# Patient Record
Sex: Male | Born: 1935 | Race: White | Hispanic: No | Marital: Single | Smoking: Never smoker
Health system: Southern US, Community
[De-identification: ages and names within clinical notes are randomized; demographics above are authoritative.]

## PROBLEM LIST (undated history)

## (undated) DIAGNOSIS — J8410 Pulmonary fibrosis, unspecified: Secondary | ICD-10-CM

## (undated) DIAGNOSIS — I10 Essential (primary) hypertension: Secondary | ICD-10-CM

## (undated) DIAGNOSIS — N189 Chronic kidney disease, unspecified: Secondary | ICD-10-CM

## (undated) DIAGNOSIS — I714 Abdominal aortic aneurysm, without rupture, unspecified: Secondary | ICD-10-CM

## (undated) DIAGNOSIS — C6290 Malignant neoplasm of unspecified testis, unspecified whether descended or undescended: Secondary | ICD-10-CM

## (undated) HISTORY — DX: Abdominal aortic aneurysm, without rupture, unspecified: I71.40

## (undated) HISTORY — DX: Abdominal aortic aneurysm, without rupture: I71.4

## (undated) HISTORY — DX: Pulmonary fibrosis, unspecified: J84.10

## (undated) HISTORY — PX: ABDOMINAL AORTIC ANEURYSM REPAIR: SUR1152

## (undated) HISTORY — DX: Essential (primary) hypertension: I10

## (undated) HISTORY — DX: Malignant neoplasm of unspecified testis, unspecified whether descended or undescended: C62.90

## (undated) HISTORY — DX: Chronic kidney disease, unspecified: N18.9

## (undated) HISTORY — PX: FINGER AMPUTATION: SHX636

---

## 2004-08-07 ENCOUNTER — Emergency Department: Payer: Self-pay | Admitting: Emergency Medicine

## 2004-12-17 ENCOUNTER — Ambulatory Visit: Payer: Self-pay | Admitting: Gastroenterology

## 2005-01-30 ENCOUNTER — Ambulatory Visit: Payer: Self-pay | Admitting: Gastroenterology

## 2006-02-05 ENCOUNTER — Ambulatory Visit: Payer: Self-pay | Admitting: Gastroenterology

## 2007-08-26 ENCOUNTER — Ambulatory Visit: Payer: Self-pay | Admitting: Nephrology

## 2008-07-01 ENCOUNTER — Ambulatory Visit: Payer: Self-pay | Admitting: Internal Medicine

## 2008-07-01 ENCOUNTER — Ambulatory Visit: Payer: Self-pay | Admitting: Gastroenterology

## 2008-08-16 ENCOUNTER — Ambulatory Visit: Payer: Self-pay | Admitting: Gastroenterology

## 2008-09-01 ENCOUNTER — Emergency Department: Payer: Self-pay | Admitting: Emergency Medicine

## 2008-09-10 ENCOUNTER — Ambulatory Visit: Payer: Self-pay | Admitting: Gastroenterology

## 2008-10-22 ENCOUNTER — Ambulatory Visit: Payer: Self-pay | Admitting: Gastroenterology

## 2008-10-23 ENCOUNTER — Ambulatory Visit: Payer: Self-pay | Admitting: Gastroenterology

## 2008-11-07 ENCOUNTER — Ambulatory Visit: Payer: Self-pay | Admitting: Vascular Surgery

## 2008-11-29 ENCOUNTER — Ambulatory Visit: Payer: Self-pay | Admitting: Cardiology

## 2008-11-29 ENCOUNTER — Ambulatory Visit: Payer: Self-pay | Admitting: Vascular Surgery

## 2008-12-03 ENCOUNTER — Inpatient Hospital Stay: Payer: Self-pay | Admitting: Vascular Surgery

## 2009-05-26 ENCOUNTER — Emergency Department: Payer: Self-pay | Admitting: Emergency Medicine

## 2009-08-20 ENCOUNTER — Emergency Department: Payer: Self-pay | Admitting: Emergency Medicine

## 2010-12-03 ENCOUNTER — Inpatient Hospital Stay: Payer: Self-pay | Admitting: Internal Medicine

## 2010-12-03 ENCOUNTER — Encounter: Payer: Self-pay | Admitting: Cardiology

## 2010-12-06 ENCOUNTER — Encounter: Payer: Self-pay | Admitting: Cardiology

## 2010-12-06 DIAGNOSIS — I359 Nonrheumatic aortic valve disorder, unspecified: Secondary | ICD-10-CM

## 2010-12-07 DIAGNOSIS — I951 Orthostatic hypotension: Secondary | ICD-10-CM

## 2010-12-07 DIAGNOSIS — R55 Syncope and collapse: Secondary | ICD-10-CM

## 2010-12-11 DIAGNOSIS — R55 Syncope and collapse: Secondary | ICD-10-CM

## 2010-12-18 ENCOUNTER — Encounter: Payer: Self-pay | Admitting: Cardiology

## 2010-12-22 ENCOUNTER — Encounter: Payer: Self-pay | Admitting: Cardiology

## 2010-12-26 ENCOUNTER — Encounter (INDEPENDENT_AMBULATORY_CARE_PROVIDER_SITE_OTHER): Payer: Medicare Other | Admitting: Cardiology

## 2010-12-26 ENCOUNTER — Encounter: Payer: Medicare Other | Admitting: Cardiovascular Disease

## 2010-12-26 ENCOUNTER — Encounter: Payer: Self-pay | Admitting: Cardiology

## 2010-12-26 DIAGNOSIS — I714 Abdominal aortic aneurysm, without rupture, unspecified: Secondary | ICD-10-CM | POA: Insufficient documentation

## 2010-12-26 DIAGNOSIS — R609 Edema, unspecified: Secondary | ICD-10-CM | POA: Insufficient documentation

## 2010-12-26 DIAGNOSIS — R55 Syncope and collapse: Secondary | ICD-10-CM | POA: Insufficient documentation

## 2010-12-26 DIAGNOSIS — N259 Disorder resulting from impaired renal tubular function, unspecified: Secondary | ICD-10-CM | POA: Insufficient documentation

## 2010-12-26 DIAGNOSIS — I951 Orthostatic hypotension: Secondary | ICD-10-CM | POA: Insufficient documentation

## 2010-12-30 NOTE — Assessment & Plan Note (Signed)
Summary: [facility] F/U from 12/12/10 / [facility]   Visit Type:  Initial Consult Primary Provider:  Barry Brunner, M.D.  CC:  F/U [facility].  Doesn't have much energy. Marland Kitchen  History of Present Illness: 77 year old male with long history of orthostatic hypotension for followup. He has had this for at least 10 years. It has been attributed to chemotherapeutic agent used for his testicular cancer in the past. He was admitted to [facility] in February of 2012 with syncope after trying to wean his Florinef. He had a systolic drop in his blood pressure of 50-100 mmHg. Midodrine was added at 10 mg p.o. t.i.d. but he had severe elevation in blood pressure at that dose with a systolic at 220. His medications were adjusted and ultimately included Mestinon, Florinef and low-dose midodrine. An echocardiogram showed normal LV function. There was mild tricuspid regurgitation and aortic insufficiency. The patient also has renal insufficiency. His potassium was 3.5 on March 8 and his BUN and creatinine were 28 and 1.72. Since he was discharged he has improved significantly. He denies dyspnea, chest pain, palpitations. He does not have presyncope or syncope with standing. He has developed significant pedal edema that increases during the day and improves overnight.  Current Medications (verified): 1)  Vitamin D3 50000 Unit Caps (Cholecalciferol) .... One Caps Every Month On The Third 2)  Omeprazole 20 Mg Cpdr (Omeprazole) .Marland Kitchen.. 1 Tablet Once Daily 3)  Florinef 0.2mg  .... 1 Tablet Two Times A Day 4)  Mestinon 60 Mg Tabs (Pyridostigmine Bromide) .Marland Kitchen.. 1 Tablet Once Daily 5)  Midodrine Hcl 5 Mg Tabs (Midodrine Hcl) .Marland Kitchen.. 1 Tablet Four Times A Day 6)  Xanax 0.25 Mg Tabs (Alprazolam) .Marland Kitchen.. 1 Tablet Two Times A Day As Needed 7)  Ted's .... Daily 8)  Ferrous Sulfate 325 (65 Fe) Mg Tabs (Ferrous Sulfate) .... One Tablet Two Times A Day  Allergies (verified): 1)  ! Pcn  Past History:  Family History: Last updated: 12/25/2010 Father:  heart disease-deceased age 52 Mother: AAA and stroke  Social History: Last updated: 12/25/2010 Retired  Single  Tobacco Use - No.  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  Family History: Reviewed history from 12/25/2010 and no changes required. Father: heart disease-deceased age 39 Mother: AAA and stroke  Social History: Reviewed history from 12/25/2010 and no changes required. Retired  Single  Tobacco Use - No.  Alcohol Use - no Regular Exercise - no Drug Use - no  Review of Systems       no fevers or chills, productive cough, hemoptysis, dysphasia, odynophagia, melena, hematochezia, dysuria, hematuria, rash, seizure activity, orthopnea, PND, pedal edema, claudication. Remaining systems are negative.   Vital Signs:  Patient profile:   77 year old male Height:      72 inches Weight:      209 pounds BMI:     28.45 Pulse rate:   60 / minute BP supine:   134 / 70  (left arm) BP sitting:   132 / 70  (left arm) BP standing:   112 / 58  (left arm) Cuff size:   regular  Vitals Entered By: Bishop Dublin, CMA (  December 26, 2010 3:00 PM)   Physical Exam  General:  Well-developed well-nourished in no acute distress.  Skin is warm and dry.  HEENT is normal.  Neck is supple. No thyromegaly.  Chest basilar crackles left greater than right Cardiovascular exam is regular rate and rhythm.  Abdominal exam nontender or distended. No masses palpated. Extremities show 2+ edema. neuro grossly intact    EKG  Procedure date:  12/26/2010  Findings:       Sinus rhythm at a rate of 60. First degree AV block. Prolonged QT interval. Nonspecific ST changes.  Impression & Recommendations:  Problem # 1:  HYPOTENSION, ORTHOSTATIC (ICD-458.0) This is much improved on present medications. It is a long standing problem and presumed secondary to the chemotherapy used to treat his testicular cancer in the past. We will continue with his present medications. He has developed increased edema. However I am hesitant to decrease his Florinef as he has had problems with recurrent syncope. He will continue with compression hose and keep his feet elevated. His family also was concerned about other possible etiologies of his orthostatic hypotension. We will obtain records from [facility] concerning previous evaluation. I believe it is most likely related to his previous chemotherapy. We may need to decrease his Florinef in the future if his edema worsens.  Problem # 2:  RENAL INSUFFICIENCY (ICD-588.9)  Problem # 3:  SYNCOPE (ICD-780.2) No recurrent episodes on present medications. The following medications were removed from the medication list:    Medrol (pak) 4 Mg Tabs (Methylprednisolone) .Marland Kitchen... Take as directed  Problem # 4:  EDEMA (ICD-782.3) As per #1.  Problem # 5:  ABDOMINAL AORTIC ANEURYSM (ICD-441.4) Managed by vascular surgery.  Patient Instructions: 1)  Your physician recommends that you schedule a follow-up appointment in: 6-8 weeks with Dr. Mariah Milling 2)  Your physician recommends that you continue on your current medications as directed. Please refer to the Current Medication list given to you today.

## 2011-02-02 ENCOUNTER — Telehealth: Payer: Self-pay | Admitting: Cardiovascular Disease

## 2011-02-02 ENCOUNTER — Ambulatory Visit: Payer: Medicare Other | Admitting: Cardiovascular Disease

## 2011-02-02 NOTE — Telephone Encounter (Signed)
LMOM to call back to reschedule appt from 02/02/11.

## 2011-02-05 ENCOUNTER — Encounter: Payer: Self-pay | Admitting: Cardiology

## 2011-02-09 ENCOUNTER — Encounter: Payer: Self-pay | Admitting: *Deleted

## 2011-02-10 ENCOUNTER — Other Ambulatory Visit: Payer: Self-pay | Admitting: Cardiology

## 2011-05-05 ENCOUNTER — Emergency Department: Payer: Self-pay | Admitting: Unknown Physician Specialty

## 2011-05-17 ENCOUNTER — Ambulatory Visit: Payer: Self-pay | Admitting: Gastroenterology

## 2011-07-05 ENCOUNTER — Emergency Department: Payer: Self-pay | Admitting: Emergency Medicine

## 2011-07-07 ENCOUNTER — Telehealth: Payer: Self-pay | Admitting: *Deleted

## 2011-07-07 NOTE — Telephone Encounter (Signed)
Called pt to see if wanted to f/u with TG post hospital (holter), pt does not want f/u at this time, thinks he is going to see another cardiologist today, but will call if wants to schedule here.

## 2011-07-09 ENCOUNTER — Telehealth: Payer: Self-pay | Admitting: Cardiovascular Disease

## 2011-07-09 NOTE — Telephone Encounter (Signed)
Pt daughter Eunice Blase called to check to see if monitor had been read from the hospital. Informed daughter that nurse had tried to schedule an appt with pt but pt refused appt stating that he had another md. Daughter states pt doesn't know what he is talking about. I offered pt daughter an appt to bring him in. She refused appt also stating she just wants monitor results then she will decide if he needs to come in

## 2011-07-10 NOTE — Telephone Encounter (Signed)
Spoke to daughter, she just wanted to see if holter showed any abnormality, if not she didn't want to add one more doctor to pt's care at this time if unnecessary. Notified her that results may not be completely downloaded at this time, and after read by MD then we could call with results. Daughter ok with this. I spoke with Britta Mccreedy at [facility] at [facility], she is processing now and will fax by Monday. She said pt can f/u with TG if he feels it is needed pending holter results.

## 2011-07-13 ENCOUNTER — Telehealth: Payer: Self-pay | Admitting: *Deleted

## 2011-07-13 ENCOUNTER — Encounter: Payer: Self-pay | Admitting: Cardiovascular Disease

## 2011-07-13 DIAGNOSIS — R42 Dizziness and giddiness: Secondary | ICD-10-CM

## 2011-07-13 NOTE — Telephone Encounter (Signed)
Per Dr. Mariah Milling, notified daughter of holter results from [facility], NSR overall with frequent PVCs, short run of SVT. Referring to previous phone note, told her that pt could schedule f/u if symptomatic. They will discuss and call back if want to follow up. Will fax result to pt per daughter request.

## 2011-08-13 ENCOUNTER — Inpatient Hospital Stay: Payer: Self-pay | Admitting: Internal Medicine

## 2011-09-29 ENCOUNTER — Ambulatory Visit: Payer: Self-pay | Admitting: Specialist

## 2012-05-09 ENCOUNTER — Emergency Department: Payer: Self-pay | Admitting: Emergency Medicine

## 2012-05-09 LAB — URINALYSIS, COMPLETE
Bacteria: NONE SEEN
Bilirubin,UR: NEGATIVE
Blood: NEGATIVE
Glucose,UR: 50 mg/dL (ref 0–75)
Ketone: NEGATIVE
Specific Gravity: 1.013 (ref 1.003–1.030)
Squamous Epithelial: <1
WBC UR: <1 /HPF (ref 0–5)

## 2012-07-15 ENCOUNTER — Emergency Department: Payer: Self-pay | Admitting: Emergency Medicine

## 2012-07-15 LAB — CBC
HGB: 12.2 g/dL — ABNORMAL LOW (ref 13.0–18.0)
MCH: 30.7 pg (ref 26.0–34.0)
Platelet: 149 10*3/uL — ABNORMAL LOW (ref 150–440)
RBC: 3.96 10*6/uL — ABNORMAL LOW (ref 4.40–5.90)
WBC: 8.8 10*3/uL (ref 3.8–10.6)

## 2012-07-15 LAB — COMPREHENSIVE METABOLIC PANEL
Alkaline Phosphatase: 115 U/L (ref 50–136)
Bilirubin,Total: 0.6 mg/dL (ref 0.2–1.0)
Chloride: 107 mmol/L (ref 98–107)
Co2: 28 mmol/L (ref 21–32)
Creatinine: 1.85 mg/dL — ABNORMAL HIGH (ref 0.60–1.30)
EGFR (African American): 40 — ABNORMAL LOW
EGFR (Non-African Amer.): 34 — ABNORMAL LOW
Osmolality: 293 (ref 275–301)
Potassium: 4.4 mmol/L (ref 3.5–5.1)
Sodium: 142 mmol/L (ref 136–145)

## 2012-07-15 LAB — TROPONIN I: Troponin-I: <0.02 ng/mL

## 2012-07-23 ENCOUNTER — Emergency Department: Payer: Self-pay | Admitting: Emergency Medicine

## 2012-07-23 LAB — URINALYSIS, COMPLETE
Bacteria: NONE SEEN
Blood: NEGATIVE
Glucose,UR: NEGATIVE mg/dL (ref 0–75)
Leukocyte Esterase: NEGATIVE
Nitrite: NEGATIVE
RBC,UR: 1 /HPF (ref 0–5)
WBC UR: NONE SEEN /HPF (ref 0–5)

## 2012-07-23 LAB — CBC WITH DIFFERENTIAL/PLATELET
Eosinophil %: 1.4 %
HCT: 38.8 % — ABNORMAL LOW (ref 40.0–52.0)
HGB: 13.2 g/dL (ref 13.0–18.0)
MCH: 31.1 pg (ref 26.0–34.0)
MCV: 92 fL (ref 80–100)
Neutrophil #: 7.8 10*3/uL — ABNORMAL HIGH (ref 1.4–6.5)
Platelet: 154 10*3/uL (ref 150–440)
RDW: 12.8 % (ref 11.5–14.5)

## 2012-07-23 LAB — BASIC METABOLIC PANEL
Calcium, Total: 8.8 mg/dL (ref 8.5–10.1)
Chloride: 106 mmol/L (ref 98–107)
EGFR (Non-African Amer.): 38 — ABNORMAL LOW
Glucose: 120 mg/dL — ABNORMAL HIGH (ref 65–99)
Osmolality: 292 (ref 275–301)
Potassium: 4.4 mmol/L (ref 3.5–5.1)
Sodium: 143 mmol/L (ref 136–145)

## 2012-07-26 ENCOUNTER — Emergency Department: Payer: Self-pay | Admitting: Emergency Medicine

## 2012-07-26 LAB — BASIC METABOLIC PANEL
Anion Gap: 7 (ref 7–16)
BUN: 29 mg/dL — ABNORMAL HIGH (ref 7–18)
Co2: 29 mmol/L (ref 21–32)
Creatinine: 1.86 mg/dL — ABNORMAL HIGH (ref 0.60–1.30)
EGFR (African American): 39 — ABNORMAL LOW
EGFR (Non-African Amer.): 34 — ABNORMAL LOW
Osmolality: 288 (ref 275–301)
Sodium: 141 mmol/L (ref 136–145)

## 2012-07-26 LAB — CBC
HCT: 39.9 % — ABNORMAL LOW (ref 40.0–52.0)
HGB: 13.5 g/dL (ref 13.0–18.0)
MCH: 31.2 pg (ref 26.0–34.0)
MCHC: 33.9 g/dL (ref 32.0–36.0)

## 2012-08-07 ENCOUNTER — Emergency Department: Payer: Self-pay | Admitting: Unknown Physician Specialty

## 2012-08-07 ENCOUNTER — Emergency Department: Payer: Self-pay | Admitting: Emergency Medicine

## 2012-08-07 LAB — CBC
HCT: 36.5 % — ABNORMAL LOW (ref 40.0–52.0)
HCT: 34.7 % — ABNORMAL LOW (ref 40.0–52.0)
MCH: 30.4 pg (ref 26.0–34.0)
MCH: 31.1 pg (ref 26.0–34.0)
MCHC: 33.1 g/dL (ref 32.0–36.0)
MCHC: 33.6 g/dL (ref 32.0–36.0)
MCV: 92 fL (ref 80–100)
Platelet: 212 10*3/uL (ref 150–440)
Platelet: 226 10*3/uL (ref 150–440)
RDW: 13.2 % (ref 11.5–14.5)
RDW: 13 % (ref 11.5–14.5)
WBC: 7.2 10*3/uL (ref 3.8–10.6)
WBC: 8.1 10*3/uL (ref 3.8–10.6)

## 2012-08-07 LAB — URINALYSIS, COMPLETE
Glucose,UR: 50 mg/dL (ref 0–75)
Ketone: NEGATIVE
Leukocyte Esterase: NEGATIVE
Nitrite: NEGATIVE
Protein: NEGATIVE
RBC,UR: <1 /HPF (ref 0–5)
Specific Gravity: 1.01 (ref 1.003–1.030)
WBC UR: NONE SEEN /HPF (ref 0–5)

## 2012-08-07 LAB — COMPREHENSIVE METABOLIC PANEL
Anion Gap: 8 (ref 7–16)
Bilirubin,Total: 0.5 mg/dL (ref 0.2–1.0)
Chloride: 106 mmol/L (ref 98–107)
Co2: 27 mmol/L (ref 21–32)
Creatinine: 1.81 mg/dL — ABNORMAL HIGH (ref 0.60–1.30)
EGFR (African American): 41 — ABNORMAL LOW
EGFR (Non-African Amer.): 35 — ABNORMAL LOW
Glucose: 97 mg/dL (ref 65–99)
SGOT(AST): 24 U/L (ref 15–37)
SGPT (ALT): 15 U/L (ref 12–78)
Sodium: 141 mmol/L (ref 136–145)

## 2012-08-07 LAB — CK TOTAL AND CKMB (NOT AT ARMC): CK-MB: 0.8 ng/mL (ref 0.5–3.6)

## 2012-08-07 LAB — TROPONIN I: Troponin-I: <0.02 ng/mL

## 2012-08-12 ENCOUNTER — Inpatient Hospital Stay: Payer: Self-pay | Admitting: Internal Medicine

## 2012-08-12 LAB — COMPREHENSIVE METABOLIC PANEL
Albumin: 3 g/dL — ABNORMAL LOW (ref 3.4–5.0)
Alkaline Phosphatase: 109 U/L (ref 50–136)
Anion Gap: 9 (ref 7–16)
BUN: 49 mg/dL — ABNORMAL HIGH (ref 7–18)
Calcium, Total: 8.7 mg/dL (ref 8.5–10.1)
Chloride: 110 mmol/L — ABNORMAL HIGH (ref 98–107)
Creatinine: 2.26 mg/dL — ABNORMAL HIGH (ref 0.60–1.30)
EGFR (African American): 31 — ABNORMAL LOW
Glucose: 139 mg/dL — ABNORMAL HIGH (ref 65–99)
Osmolality: 300 (ref 275–301)
Sodium: 143 mmol/L (ref 136–145)
Total Protein: 6.4 g/dL (ref 6.4–8.2)

## 2012-08-12 LAB — PROTIME-INR
INR: 1.1
Prothrombin Time: 14.3 secs (ref 11.5–14.7)

## 2012-08-12 LAB — URINALYSIS, COMPLETE
Bilirubin,UR: NEGATIVE
Glucose,UR: 150 mg/dL (ref 0–75)
Ketone: NEGATIVE
Nitrite: NEGATIVE
Protein: 30
Specific Gravity: 1.024 (ref 1.003–1.030)
WBC UR: 2 /HPF (ref 0–5)

## 2012-08-12 LAB — CBC WITH DIFFERENTIAL/PLATELET
Basophil #: 0.1 10*3/uL (ref 0.0–0.1)
Basophil %: 0.5 %
Eosinophil #: 0.1 10*3/uL (ref 0.0–0.7)
Eosinophil %: 0.6 %
HGB: 11.4 g/dL — ABNORMAL LOW (ref 13.0–18.0)
Lymphocyte %: 7.6 %
MCH: 31.1 pg (ref 26.0–34.0)
MCHC: 33.9 g/dL (ref 32.0–36.0)
Monocyte #: 0.7 x10 3/mm (ref 0.2–1.0)
Neutrophil %: 84.9 %
Platelet: 145 10*3/uL — ABNORMAL LOW (ref 150–440)
RBC: 3.68 10*6/uL — ABNORMAL LOW (ref 4.40–5.90)
WBC: 11 10*3/uL — ABNORMAL HIGH (ref 3.8–10.6)

## 2012-08-13 LAB — BASIC METABOLIC PANEL
Anion Gap: 8 (ref 7–16)
Calcium, Total: 8 mg/dL — ABNORMAL LOW (ref 8.5–10.1)
Chloride: 108 mmol/L — ABNORMAL HIGH (ref 98–107)
Co2: 26 mmol/L (ref 21–32)
Osmolality: 294 (ref 275–301)
Potassium: 3.8 mmol/L (ref 3.5–5.1)
Sodium: 142 mmol/L (ref 136–145)

## 2012-08-13 LAB — CULTURE, BLOOD (SINGLE)

## 2012-08-13 LAB — CBC WITH DIFFERENTIAL/PLATELET
Basophil #: 0 10*3/uL (ref 0.0–0.1)
Basophil %: 0.5 %
Eosinophil #: 0.3 10*3/uL (ref 0.0–0.7)
Eosinophil %: 4.2 %
HCT: 29.3 % — ABNORMAL LOW (ref 40.0–52.0)
Lymphocyte #: 0.7 10*3/uL — ABNORMAL LOW (ref 1.0–3.6)
MCH: 30.5 pg (ref 26.0–34.0)
MCV: 92 fL (ref 80–100)
Monocyte %: 9.8 %
Neutrophil #: 4.7 10*3/uL (ref 1.4–6.5)
Platelet: 110 10*3/uL — ABNORMAL LOW (ref 150–440)
RBC: 3.18 10*6/uL — ABNORMAL LOW (ref 4.40–5.90)
RDW: 13.1 % (ref 11.5–14.5)
WBC: 6.3 10*3/uL (ref 3.8–10.6)

## 2012-08-14 LAB — IRON AND TIBC
Iron Bind.Cap.(Total): 212 ug/dL — ABNORMAL LOW (ref 250–450)
Iron Saturation: 14 %
Iron: 29 ug/dL — ABNORMAL LOW (ref 65–175)
Unbound Iron-Bind.Cap.: 183 ug/dL

## 2012-08-14 LAB — CBC WITH DIFFERENTIAL/PLATELET
Eosinophil %: 0 %
HCT: 28.6 % — ABNORMAL LOW (ref 40.0–52.0)
Lymphocyte #: 0.4 10*3/uL — ABNORMAL LOW (ref 1.0–3.6)
Lymphocyte %: 8.8 %
MCH: 31.7 pg (ref 26.0–34.0)
MCV: 93 fL (ref 80–100)
Monocyte #: 0.2 x10 3/mm (ref 0.2–1.0)
Monocyte %: 3.6 %
Neutrophil #: 4.2 10*3/uL (ref 1.4–6.5)
Platelet: 100 10*3/uL — ABNORMAL LOW (ref 150–440)
RBC: 3.07 10*6/uL — ABNORMAL LOW (ref 4.40–5.90)
WBC: 4.8 10*3/uL (ref 3.8–10.6)

## 2012-08-14 LAB — BASIC METABOLIC PANEL
Anion Gap: 8 (ref 7–16)
Calcium, Total: 8 mg/dL — ABNORMAL LOW (ref 8.5–10.1)
Chloride: 110 mmol/L — ABNORMAL HIGH (ref 98–107)
Co2: 26 mmol/L (ref 21–32)
EGFR (Non-African Amer.): 40 — ABNORMAL LOW
Sodium: 144 mmol/L (ref 136–145)

## 2012-08-14 LAB — FERRITIN: Ferritin (ARMC): 848 ng/mL — ABNORMAL HIGH (ref 8–388)

## 2012-08-15 LAB — CBC WITH DIFFERENTIAL/PLATELET
Basophil #: 0 10*3/uL (ref 0.0–0.1)
Basophil %: 0.4 %
Eosinophil #: 0 10*3/uL (ref 0.0–0.7)
Eosinophil %: 0.1 %
HGB: 11 g/dL — ABNORMAL LOW (ref 13.0–18.0)
Lymphocyte %: 5.9 %
MCHC: 34.4 g/dL (ref 32.0–36.0)
Neutrophil %: 90 %
RBC: 3.54 10*6/uL — ABNORMAL LOW (ref 4.40–5.90)
RDW: 13 % (ref 11.5–14.5)
WBC: 7.8 10*3/uL (ref 3.8–10.6)

## 2012-08-15 LAB — BASIC METABOLIC PANEL
Anion Gap: 8 (ref 7–16)
BUN: 33 mg/dL — ABNORMAL HIGH (ref 7–18)
Co2: 30 mmol/L (ref 21–32)
Creatinine: 1.44 mg/dL — ABNORMAL HIGH (ref 0.60–1.30)
EGFR (African American): 54 — ABNORMAL LOW
EGFR (Non-African Amer.): 46 — ABNORMAL LOW
Sodium: 143 mmol/L (ref 136–145)

## 2012-08-16 ENCOUNTER — Ambulatory Visit: Payer: Self-pay | Admitting: Internal Medicine

## 2012-08-16 LAB — CBC WITH DIFFERENTIAL/PLATELET
Basophil #: 0 10*3/uL (ref 0.0–0.1)
Eosinophil #: 0 10*3/uL (ref 0.0–0.7)
HCT: 32.5 % — ABNORMAL LOW (ref 40.0–52.0)
HGB: 11 g/dL — ABNORMAL LOW (ref 13.0–18.0)
Lymphocyte #: 0.6 10*3/uL — ABNORMAL LOW (ref 1.0–3.6)
Lymphocyte %: 9.1 %
MCH: 30.4 pg (ref 26.0–34.0)
MCHC: 33.9 g/dL (ref 32.0–36.0)
MCV: 90 fL (ref 80–100)
Monocyte #: 0.1 x10 3/mm — ABNORMAL LOW (ref 0.2–1.0)
Monocyte %: 1.5 %
Neutrophil #: 6.1 10*3/uL (ref 1.4–6.5)
Neutrophil %: 88.7 %
Platelet: 120 10*3/uL — ABNORMAL LOW (ref 150–440)
RDW: 12.9 % (ref 11.5–14.5)
WBC: 6.8 10*3/uL (ref 3.8–10.6)

## 2012-08-16 LAB — BASIC METABOLIC PANEL
BUN: 36 mg/dL — ABNORMAL HIGH (ref 7–18)
BUN: 42 mg/dL — ABNORMAL HIGH (ref 7–18)
Calcium, Total: 8.1 mg/dL — ABNORMAL LOW (ref 8.5–10.1)
Chloride: 104 mmol/L (ref 98–107)
Chloride: 101 mmol/L (ref 98–107)
Co2: 32 mmol/L (ref 21–32)
Co2: 37 mmol/L — ABNORMAL HIGH (ref 21–32)
Creatinine: 1.3 mg/dL (ref 0.60–1.30)
Creatinine: 1.54 mg/dL — ABNORMAL HIGH (ref 0.60–1.30)
EGFR (African American): 49 — ABNORMAL LOW
EGFR (Non-African Amer.): 52 — ABNORMAL LOW
Potassium: 2.9 mmol/L — ABNORMAL LOW (ref 3.5–5.1)
Potassium: 2.4 mmol/L — CL (ref 3.5–5.1)
Sodium: 144 mmol/L (ref 136–145)
Sodium: 145 mmol/L (ref 136–145)

## 2012-08-16 LAB — TSH: Thyroid Stimulating Horm: 2.15 u[IU]/mL

## 2012-08-16 LAB — POTASSIUM: Potassium: 3.1 mmol/L — ABNORMAL LOW (ref 3.5–5.1)

## 2012-08-17 DIAGNOSIS — R000 Tachycardia, unspecified: Secondary | ICD-10-CM

## 2012-08-17 LAB — COMPREHENSIVE METABOLIC PANEL
Albumin: 2.3 g/dL — ABNORMAL LOW (ref 3.4–5.0)
Anion Gap: 8 (ref 7–16)
BUN: 42 mg/dL — ABNORMAL HIGH (ref 7–18)
Calcium, Total: 7.7 mg/dL — ABNORMAL LOW (ref 8.5–10.1)
Chloride: 106 mmol/L (ref 98–107)
EGFR (Non-African Amer.): 40 — ABNORMAL LOW
Osmolality: 306 (ref 275–301)
Potassium: 2.8 mmol/L — ABNORMAL LOW (ref 3.5–5.1)
SGOT(AST): 27 U/L (ref 15–37)
Sodium: 147 mmol/L — ABNORMAL HIGH (ref 136–145)

## 2012-08-17 LAB — CBC WITH DIFFERENTIAL/PLATELET
Basophil %: 0.2 %
Eosinophil %: 0.9 %
HGB: 11.8 g/dL — ABNORMAL LOW (ref 13.0–18.0)
Lymphocyte #: 0.6 10*3/uL — ABNORMAL LOW (ref 1.0–3.6)
MCH: 30.6 pg (ref 26.0–34.0)
MCV: 90 fL (ref 80–100)
Monocyte #: 0.6 x10 3/mm (ref 0.2–1.0)
Neutrophil %: 89.6 %
Platelet: 132 10*3/uL — ABNORMAL LOW (ref 150–440)
RBC: 3.85 10*6/uL — ABNORMAL LOW (ref 4.40–5.90)
RDW: 13.1 % (ref 11.5–14.5)

## 2012-08-17 LAB — CK-MB
CK-MB: 3.3 ng/mL (ref 0.5–3.6)
CK-MB: 1.6 ng/mL (ref 0.5–3.6)
CK-MB: 1.1 ng/mL (ref 0.5–3.6)

## 2012-08-17 LAB — CULTURE, BLOOD (SINGLE)

## 2012-08-17 LAB — APTT: Activated PTT: >160 s

## 2012-08-17 LAB — TROPONIN I
Troponin-I: 0.33 ng/mL — ABNORMAL HIGH
Troponin-I: 0.16 ng/mL — ABNORMAL HIGH

## 2012-08-17 LAB — MAGNESIUM
Magnesium: 1.6 mg/dL — ABNORMAL LOW
Magnesium: 2.4 mg/dL

## 2012-08-17 LAB — OCCULT BLOOD X 1 CARD TO LAB, STOOL: Occult Blood, Feces: POSITIVE

## 2012-08-18 LAB — CBC WITH DIFFERENTIAL/PLATELET
Basophil #: 0 10*3/uL (ref 0.0–0.1)
HCT: 33 % — ABNORMAL LOW (ref 40.0–52.0)
HGB: 10.5 g/dL — ABNORMAL LOW (ref 13.0–18.0)
Lymphocyte #: 0.3 10*3/uL — ABNORMAL LOW (ref 1.0–3.6)
MCH: 29 pg (ref 26.0–34.0)
MCV: 91 fL (ref 80–100)
Monocyte #: 0.7 x10 3/mm (ref 0.2–1.0)
Platelet: 98 10*3/uL — ABNORMAL LOW (ref 150–440)
RDW: 13.4 % (ref 11.5–14.5)
WBC: 14.6 10*3/uL — ABNORMAL HIGH (ref 3.8–10.6)

## 2012-08-18 LAB — BASIC METABOLIC PANEL
Anion Gap: 10 (ref 7–16)
BUN: 33 mg/dL — ABNORMAL HIGH (ref 7–18)
Chloride: 110 mmol/L — ABNORMAL HIGH (ref 98–107)
Co2: 26 mmol/L (ref 21–32)
Creatinine: 1.46 mg/dL — ABNORMAL HIGH (ref 0.60–1.30)
Glucose: 247 mg/dL — ABNORMAL HIGH (ref 65–99)
Potassium: 3.5 mmol/L (ref 3.5–5.1)

## 2012-08-18 LAB — APTT: Activated PTT: 127.3 secs — ABNORMAL HIGH (ref 23.6–35.9)

## 2012-08-18 LAB — POTASSIUM: Potassium: 3.5 mmol/L (ref 3.5–5.1)

## 2012-08-18 LAB — PHOSPHORUS
Phosphorus: 2.4 mg/dL — ABNORMAL LOW (ref 2.5–4.9)
Phosphorus: 2.4 mg/dL — ABNORMAL LOW (ref 2.5–4.9)

## 2012-08-18 LAB — MAGNESIUM: Magnesium: 2.3 mg/dL

## 2012-08-19 LAB — CBC WITH DIFFERENTIAL/PLATELET
Basophil #: 0 10*3/uL (ref 0.0–0.1)
Basophil %: 0.2 %
HCT: 30.3 % — ABNORMAL LOW (ref 40.0–52.0)
HGB: 10.2 g/dL — ABNORMAL LOW (ref 13.0–18.0)
Lymphocyte %: 2.8 %
Monocyte %: 6 %
Neutrophil #: 15.9 10*3/uL — ABNORMAL HIGH (ref 1.4–6.5)
Neutrophil %: 91 %
WBC: 17.5 10*3/uL — ABNORMAL HIGH (ref 3.8–10.6)

## 2012-08-19 LAB — BASIC METABOLIC PANEL
BUN: 36 mg/dL — ABNORMAL HIGH (ref 7–18)
Chloride: 111 mmol/L — ABNORMAL HIGH (ref 98–107)
Co2: 27 mmol/L (ref 21–32)
Creatinine: 1.25 mg/dL (ref 0.60–1.30)
EGFR (Non-African Amer.): 55 — ABNORMAL LOW
Glucose: 171 mg/dL — ABNORMAL HIGH (ref 65–99)
Osmolality: 303 (ref 275–301)
Potassium: 4.2 mmol/L (ref 3.5–5.1)
Sodium: 146 mmol/L — ABNORMAL HIGH (ref 136–145)

## 2012-08-19 LAB — POTASSIUM: Potassium: 4 mmol/L (ref 3.5–5.1)

## 2012-08-19 LAB — PHOSPHORUS: Phosphorus: 3.7 mg/dL (ref 2.5–4.9)

## 2012-08-19 LAB — APTT: Activated PTT: 87.4 secs — ABNORMAL HIGH (ref 23.6–35.9)

## 2012-08-20 LAB — CBC WITH DIFFERENTIAL/PLATELET
Basophil #: 0 10*3/uL (ref 0.0–0.1)
Eosinophil %: 0 %
HCT: 28.9 % — ABNORMAL LOW (ref 40.0–52.0)
HGB: 9.8 g/dL — ABNORMAL LOW (ref 13.0–18.0)
Lymphocyte #: 0.3 10*3/uL — ABNORMAL LOW (ref 1.0–3.6)
Lymphocyte %: 2.1 %
MCH: 31.1 pg (ref 26.0–34.0)
MCV: 92 fL (ref 80–100)
Monocyte %: 3.1 %
Neutrophil %: 94.7 %
Platelet: 94 10*3/uL — ABNORMAL LOW (ref 150–440)
RBC: 3.14 10*6/uL — ABNORMAL LOW (ref 4.40–5.90)
RDW: 13.7 % (ref 11.5–14.5)

## 2012-08-20 LAB — VANCOMYCIN, TROUGH
Vancomycin, Trough: 23 ug/mL (ref 10–20)
Vancomycin, Trough: 22 ug/mL (ref 10–20)

## 2012-08-20 LAB — BASIC METABOLIC PANEL
Anion Gap: 7 (ref 7–16)
Calcium, Total: 7.8 mg/dL — ABNORMAL LOW (ref 8.5–10.1)
Co2: 29 mmol/L (ref 21–32)
EGFR (African American): 52 — ABNORMAL LOW
EGFR (Non-African Amer.): 45 — ABNORMAL LOW
Glucose: 190 mg/dL — ABNORMAL HIGH (ref 65–99)
Osmolality: 300 (ref 275–301)
Potassium: 3.6 mmol/L (ref 3.5–5.1)
Sodium: 142 mmol/L (ref 136–145)

## 2012-08-20 LAB — APTT: Activated PTT: 29.5 secs (ref 23.6–35.9)

## 2012-08-21 LAB — CBC WITH DIFFERENTIAL/PLATELET
Basophil #: 0 10*3/uL (ref 0.0–0.1)
Basophil %: 0 %
Eosinophil %: 0 %
HCT: 29.5 % — ABNORMAL LOW (ref 40.0–52.0)
HGB: 9.9 g/dL — ABNORMAL LOW (ref 13.0–18.0)
Lymphocyte #: 0.3 10*3/uL — ABNORMAL LOW (ref 1.0–3.6)
MCH: 30.7 pg (ref 26.0–34.0)
MCV: 92 fL (ref 80–100)
Monocyte #: 0.4 x10 3/mm (ref 0.2–1.0)
Monocyte %: 2.8 %
Neutrophil %: 94.7 %
Platelet: 116 10*3/uL — ABNORMAL LOW (ref 150–440)
RBC: 3.21 10*6/uL — ABNORMAL LOW (ref 4.40–5.90)
WBC: 13.4 10*3/uL — ABNORMAL HIGH (ref 3.8–10.6)

## 2012-08-21 LAB — BASIC METABOLIC PANEL
Anion Gap: 5 — ABNORMAL LOW (ref 7–16)
Calcium, Total: 7.8 mg/dL — ABNORMAL LOW (ref 8.5–10.1)
Co2: 31 mmol/L (ref 21–32)
EGFR (African American): 47 — ABNORMAL LOW
EGFR (Non-African Amer.): 41 — ABNORMAL LOW
Glucose: 195 mg/dL — ABNORMAL HIGH (ref 65–99)
Osmolality: 311 (ref 275–301)
Potassium: 4.3 mmol/L (ref 3.5–5.1)

## 2012-08-21 LAB — PHOSPHORUS: Phosphorus: 3.8 mg/dL (ref 2.5–4.9)

## 2012-08-22 LAB — BASIC METABOLIC PANEL
BUN: 75 mg/dL — ABNORMAL HIGH (ref 7–18)
Calcium, Total: 7.8 mg/dL — ABNORMAL LOW (ref 8.5–10.1)
Chloride: 108 mmol/L — ABNORMAL HIGH (ref 98–107)
Co2: 29 mmol/L (ref 21–32)
Osmolality: 317 (ref 275–301)
Potassium: 4.7 mmol/L (ref 3.5–5.1)
Sodium: 144 mmol/L (ref 136–145)

## 2012-08-23 LAB — CBC WITH DIFFERENTIAL/PLATELET
Basophil %: 0.1 %
Eosinophil #: 0 10*3/uL (ref 0.0–0.7)
Eosinophil %: 0 %
Lymphocyte #: 0.2 10*3/uL — ABNORMAL LOW (ref 1.0–3.6)
Lymphocyte %: 3.5 %
MCH: 30.2 pg (ref 26.0–34.0)
MCHC: 32.7 g/dL (ref 32.0–36.0)
Monocyte #: 0.3 x10 3/mm (ref 0.2–1.0)
Neutrophil %: 92.3 %
Platelet: 87 10*3/uL — ABNORMAL LOW (ref 150–440)
RBC: 3.47 10*6/uL — ABNORMAL LOW (ref 4.40–5.90)
WBC: 7 10*3/uL (ref 3.8–10.6)

## 2012-08-23 LAB — BASIC METABOLIC PANEL
BUN: 73 mg/dL — ABNORMAL HIGH (ref 7–18)
Calcium, Total: 7.7 mg/dL — ABNORMAL LOW (ref 8.5–10.1)
Chloride: 107 mmol/L (ref 98–107)
Co2: 31 mmol/L (ref 21–32)
Creatinine: 1.24 mg/dL (ref 0.60–1.30)
EGFR (African American): >60
Potassium: 5.4 mmol/L — ABNORMAL HIGH (ref 3.5–5.1)
Sodium: 143 mmol/L (ref 136–145)

## 2012-08-24 LAB — BASIC METABOLIC PANEL
Anion Gap: 9 (ref 7–16)
BUN: 68 mg/dL — ABNORMAL HIGH (ref 7–18)
Calcium, Total: 7.6 mg/dL — ABNORMAL LOW (ref 8.5–10.1)
Chloride: 101 mmol/L (ref 98–107)
Co2: 29 mmol/L (ref 21–32)
Creatinine: 1.19 mg/dL (ref 0.60–1.30)
Osmolality: 307 (ref 275–301)
Potassium: 4.9 mmol/L (ref 3.5–5.1)

## 2012-08-25 LAB — BASIC METABOLIC PANEL
Chloride: 103 mmol/L (ref 98–107)
Co2: 32 mmol/L (ref 21–32)
Creatinine: 1.21 mg/dL (ref 0.60–1.30)
EGFR (African American): >60
EGFR (Non-African Amer.): 57 — ABNORMAL LOW
Glucose: 164 mg/dL — ABNORMAL HIGH (ref 65–99)
Sodium: 139 mmol/L (ref 136–145)

## 2012-08-26 LAB — BASIC METABOLIC PANEL
Anion Gap: 6 — ABNORMAL LOW (ref 7–16)
Calcium, Total: 7.7 mg/dL — ABNORMAL LOW (ref 8.5–10.1)
Chloride: 105 mmol/L (ref 98–107)
Co2: 31 mmol/L (ref 21–32)
EGFR (African American): >60
Glucose: 165 mg/dL — ABNORMAL HIGH (ref 65–99)
Osmolality: 313 (ref 275–301)
Sodium: 142 mmol/L (ref 136–145)

## 2012-08-26 LAB — PHOSPHORUS: Phosphorus: 4.2 mg/dL (ref 2.5–4.9)

## 2012-08-27 LAB — BASIC METABOLIC PANEL
Anion Gap: 5 — ABNORMAL LOW (ref 7–16)
BUN: 97 mg/dL — ABNORMAL HIGH (ref 7–18)
Calcium, Total: 7.4 mg/dL — ABNORMAL LOW (ref 8.5–10.1)
Creatinine: 1.38 mg/dL — ABNORMAL HIGH (ref 0.60–1.30)
EGFR (Non-African Amer.): 49 — ABNORMAL LOW
Glucose: 197 mg/dL — ABNORMAL HIGH (ref 65–99)
Osmolality: 319 (ref 275–301)
Potassium: 4.6 mmol/L (ref 3.5–5.1)

## 2012-08-27 LAB — CBC WITH DIFFERENTIAL/PLATELET
Basophil #: 0 10*3/uL (ref 0.0–0.1)
Basophil %: 0 %
Eosinophil #: 0 10*3/uL (ref 0.0–0.7)
Eosinophil %: 0 %
HCT: 31.5 % — ABNORMAL LOW (ref 40.0–52.0)
HGB: 10.2 g/dL — ABNORMAL LOW (ref 13.0–18.0)
Lymphocyte #: 0.2 10*3/uL — ABNORMAL LOW (ref 1.0–3.6)
Lymphocyte %: 1.9 %
MCH: 29.7 pg (ref 26.0–34.0)
MCHC: 32.5 g/dL (ref 32.0–36.0)
Monocyte #: 0.2 x10 3/mm (ref 0.2–1.0)
Neutrophil %: 96 %
Platelet: 118 10*3/uL — ABNORMAL LOW (ref 150–440)
WBC: 11 10*3/uL — ABNORMAL HIGH (ref 3.8–10.6)

## 2012-08-29 LAB — CBC WITH DIFFERENTIAL/PLATELET
Basophil #: 0 10*3/uL (ref 0.0–0.1)
Eosinophil #: 0 10*3/uL (ref 0.0–0.7)
HGB: 10.5 g/dL — ABNORMAL LOW (ref 13.0–18.0)
MCH: 30.5 pg (ref 26.0–34.0)
Monocyte #: 0.3 x10 3/mm (ref 0.2–1.0)
Monocyte %: 2.2 %
Neutrophil %: 96.3 %
Platelet: 98 10*3/uL — ABNORMAL LOW (ref 150–440)
RBC: 3.45 10*6/uL — ABNORMAL LOW (ref 4.40–5.90)
WBC: 12.2 10*3/uL — ABNORMAL HIGH (ref 3.8–10.6)

## 2012-08-29 LAB — BASIC METABOLIC PANEL
BUN: 97 mg/dL — ABNORMAL HIGH (ref 7–18)
Creatinine: 1.44 mg/dL — ABNORMAL HIGH (ref 0.60–1.30)
EGFR (African American): 54 — ABNORMAL LOW
EGFR (Non-African Amer.): 46 — ABNORMAL LOW
Glucose: 207 mg/dL — ABNORMAL HIGH (ref 65–99)
Osmolality: 323 (ref 275–301)
Sodium: 144 mmol/L (ref 136–145)

## 2012-09-11 ENCOUNTER — Ambulatory Visit: Payer: Self-pay | Admitting: Internal Medicine

## 2012-09-11 DEATH — deceased

## 2013-07-14 IMAGING — CR DG CHEST 1V PORT
1 series · 1 of 1 positions shown · non-contrast
Comparison: none

REASON FOR EXAM: central line placement confirmation
COMMENTS:

PROCEDURE:     DXR - DXR PORTABLE CHEST SINGLE VIEW  - August 16, 2012 [DATE]
RESULT:     Comparison: None

[ap]
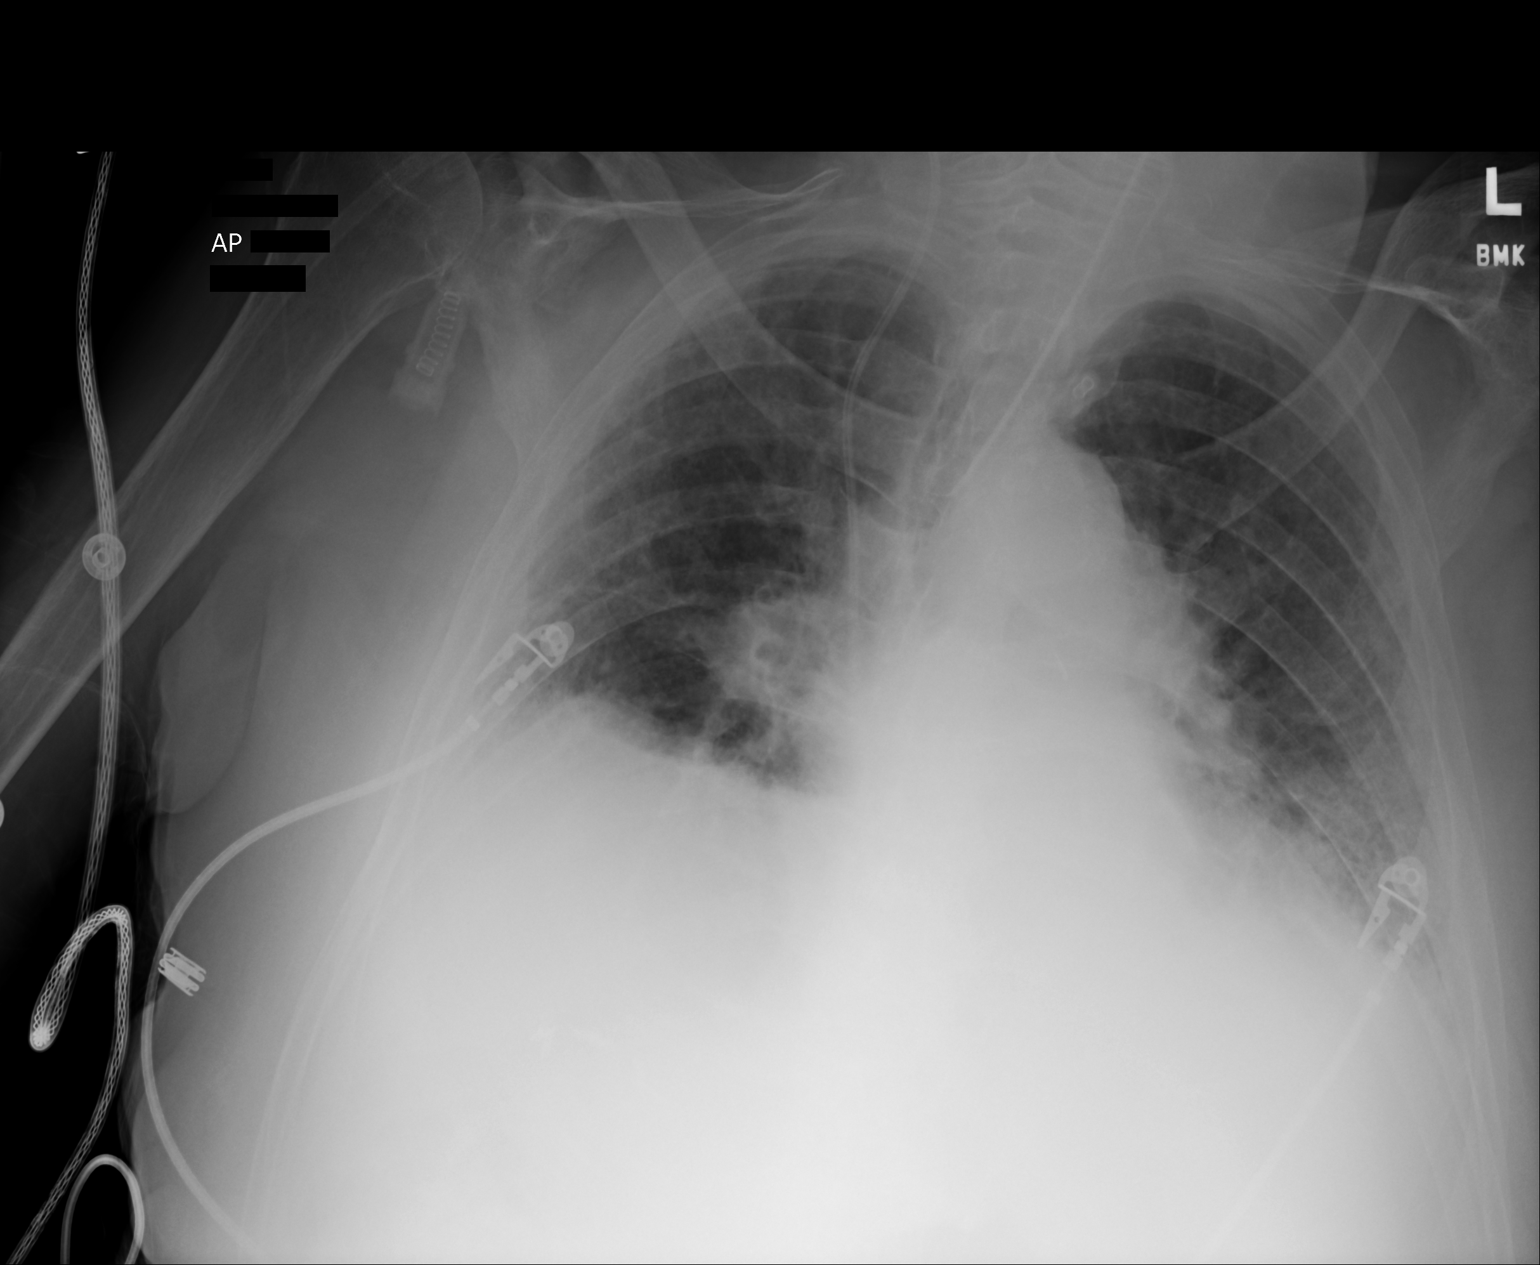

[1 of 1 positions shown; findings below may reference images not displayed]

FINDINGS: Single portable AP chest radiograph is provided. There is a right-sided
central venous catheter with the tip projecting over the right atrium. There
is a endotracheal tube with the tip 3.3 cm above the carina. There is
bibasilar airspace disease, left greater than right which may represent
atelectasis versus pneumonia. There is no pneumothorax. The heart size is
normal. The osseous structures are unremarkable.
IMPRESSION: Please see above.

[REDACTED]

## 2013-07-15 IMAGING — CR DG CHEST 1V PORT
1 series · 1 of 1 positions shown · non-contrast
Comparison: none

REASON FOR EXAM: respiratory failure, on the vent
COMMENTS:

PROCEDURE:     DXR - DXR PORTABLE CHEST SINGLE VIEW  - August 17, 2012  [DATE]
RESULT:     Comparison: 08/16/2012

[ap]
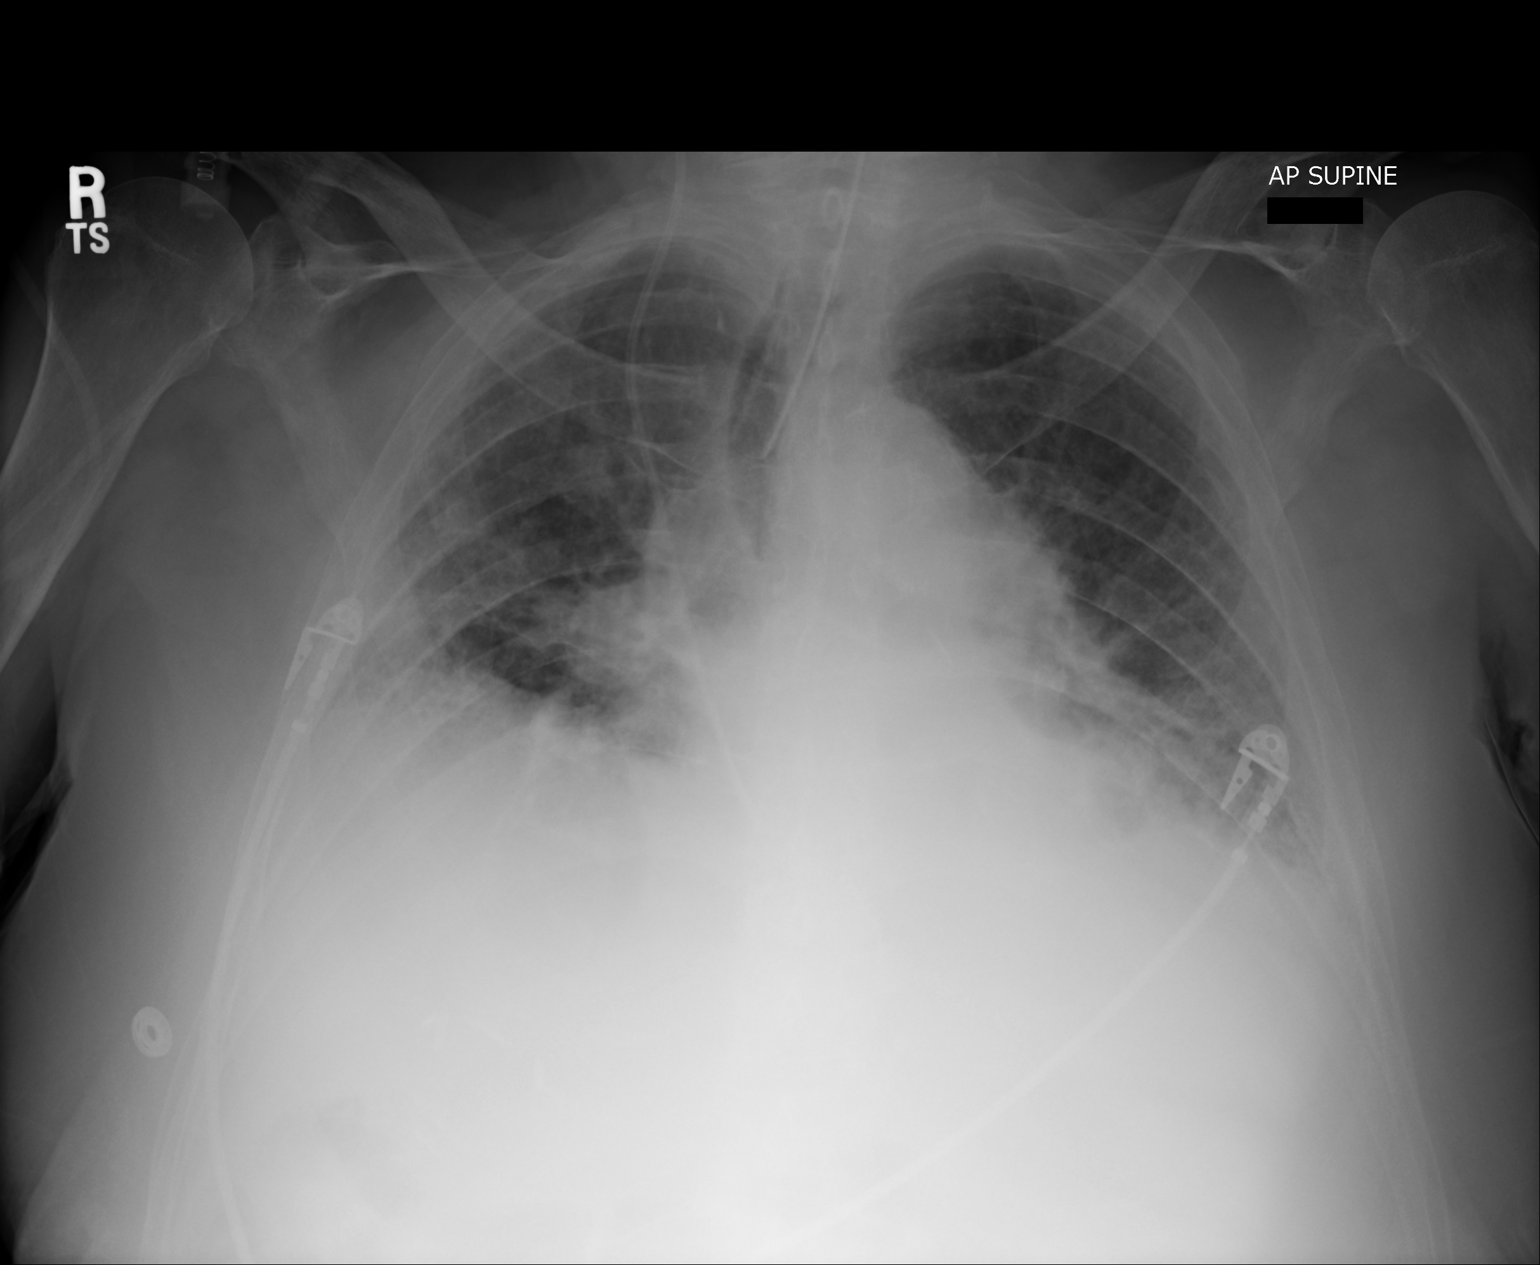

[1 of 1 positions shown; findings below may reference images not displayed]

FINDINGS: Single portable AP chest radiograph is provided. There is a right-sided
central venous catheter with the tip projecting over the right atrium. There
is a endotracheal tube with the tip 3.3 cm above the carina. There is
bibasilar airspace disease, left greater than right which may represent
atelectasis versus pneumonia. There is no pneumothorax. The heart size is
normal. The osseous structures are unremarkable.
IMPRESSION: There is no significant interval change.

[REDACTED]

## 2013-07-16 IMAGING — CR DG ABDOMEN 1V
1 series · 1 of 1 positions shown · non-contrast
Comparison: none

REASON FOR EXAM: CHECK OGT Placement
COMMENTS:

[ap]
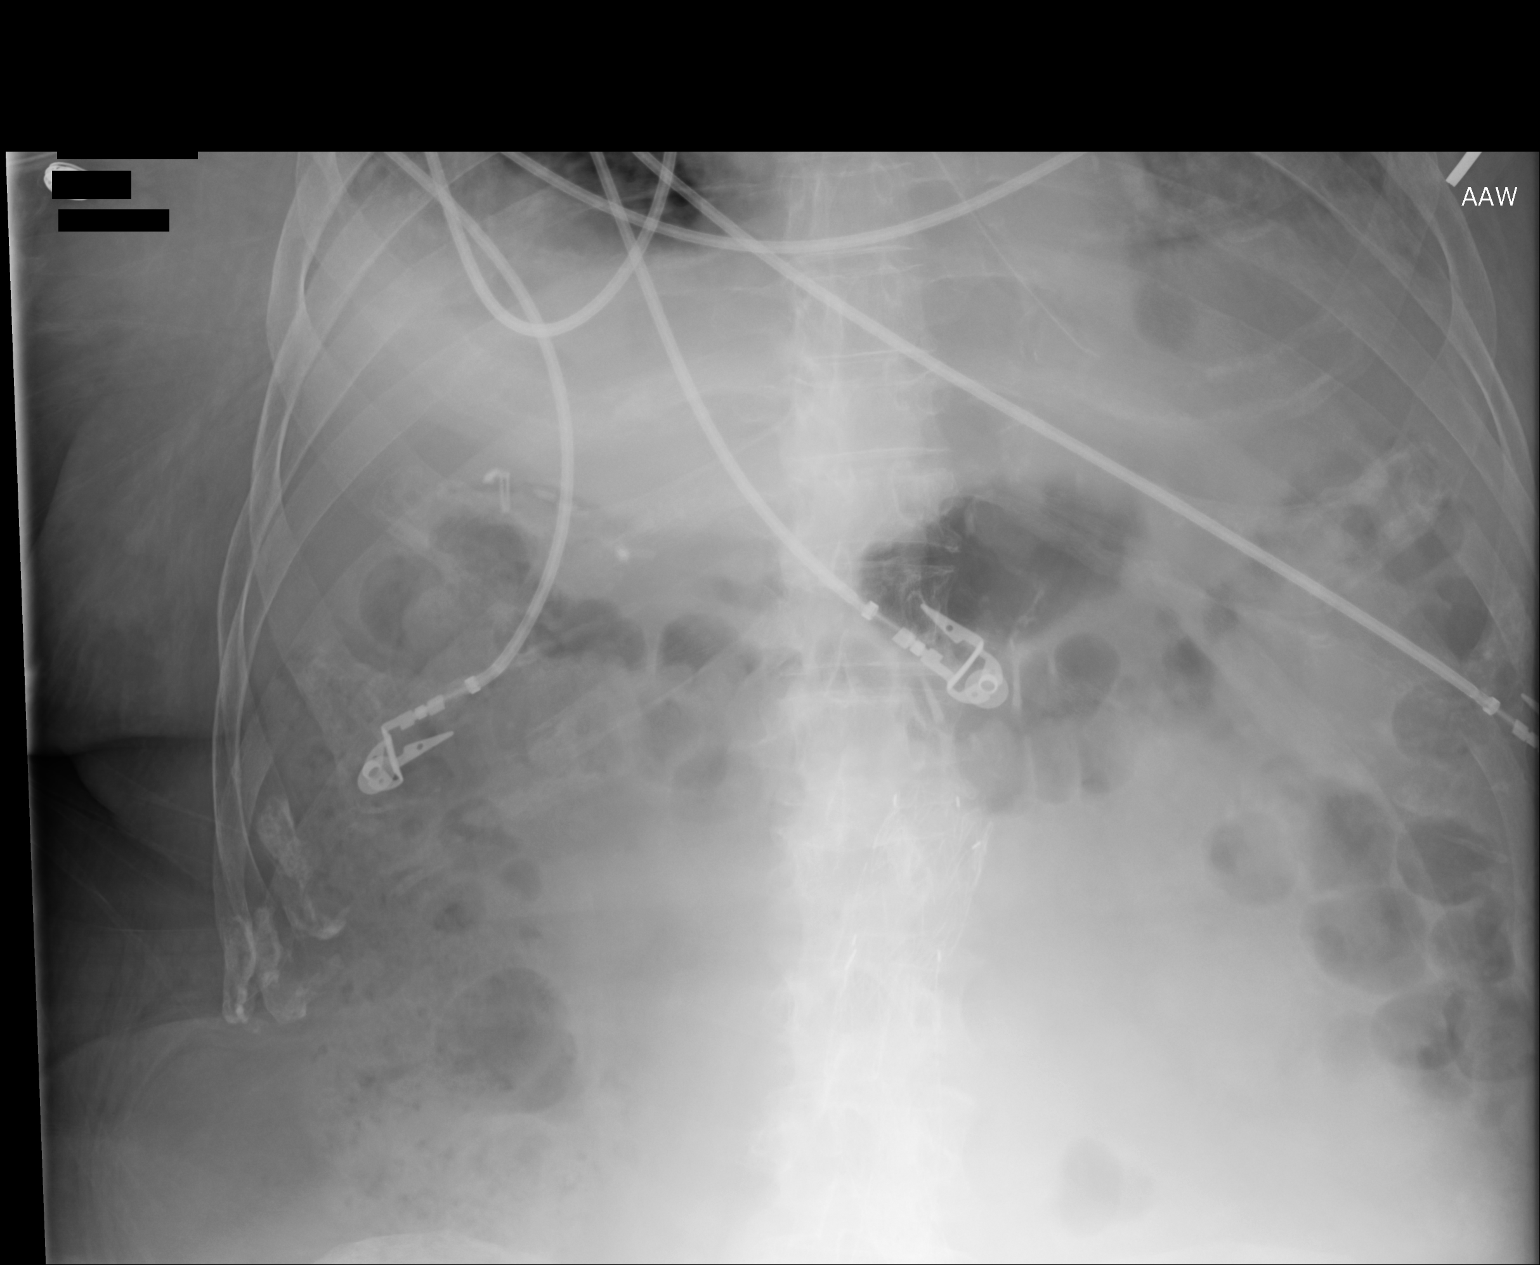

[1 of 1 positions shown; findings below may reference images not displayed]

PROCEDURE:     DXR - DXR ABDOMEN AP ONLY  - August 18, 2012  [DATE]

RESULT:     A portable film of the lower chest upper and mid abdomen is
submitted. The tip of the nasogastric tube lies in the expected location of
the gastric cardia. Advancement by 10 to 15 cm is recommended to assure that
the proximal port which is now above the hemidiaphragm will be well within
the stomach.
IMPRESSION: The orogastric tube tip is just below the level of the left
hemidiaphragm advancement is needed.

[REDACTED]

## 2013-07-17 IMAGING — CR DG CHEST 1V PORT
1 series · 1 of 1 positions shown · non-contrast
Comparison: none

REASON FOR EXAM: vent
COMMENTS:

[ap]
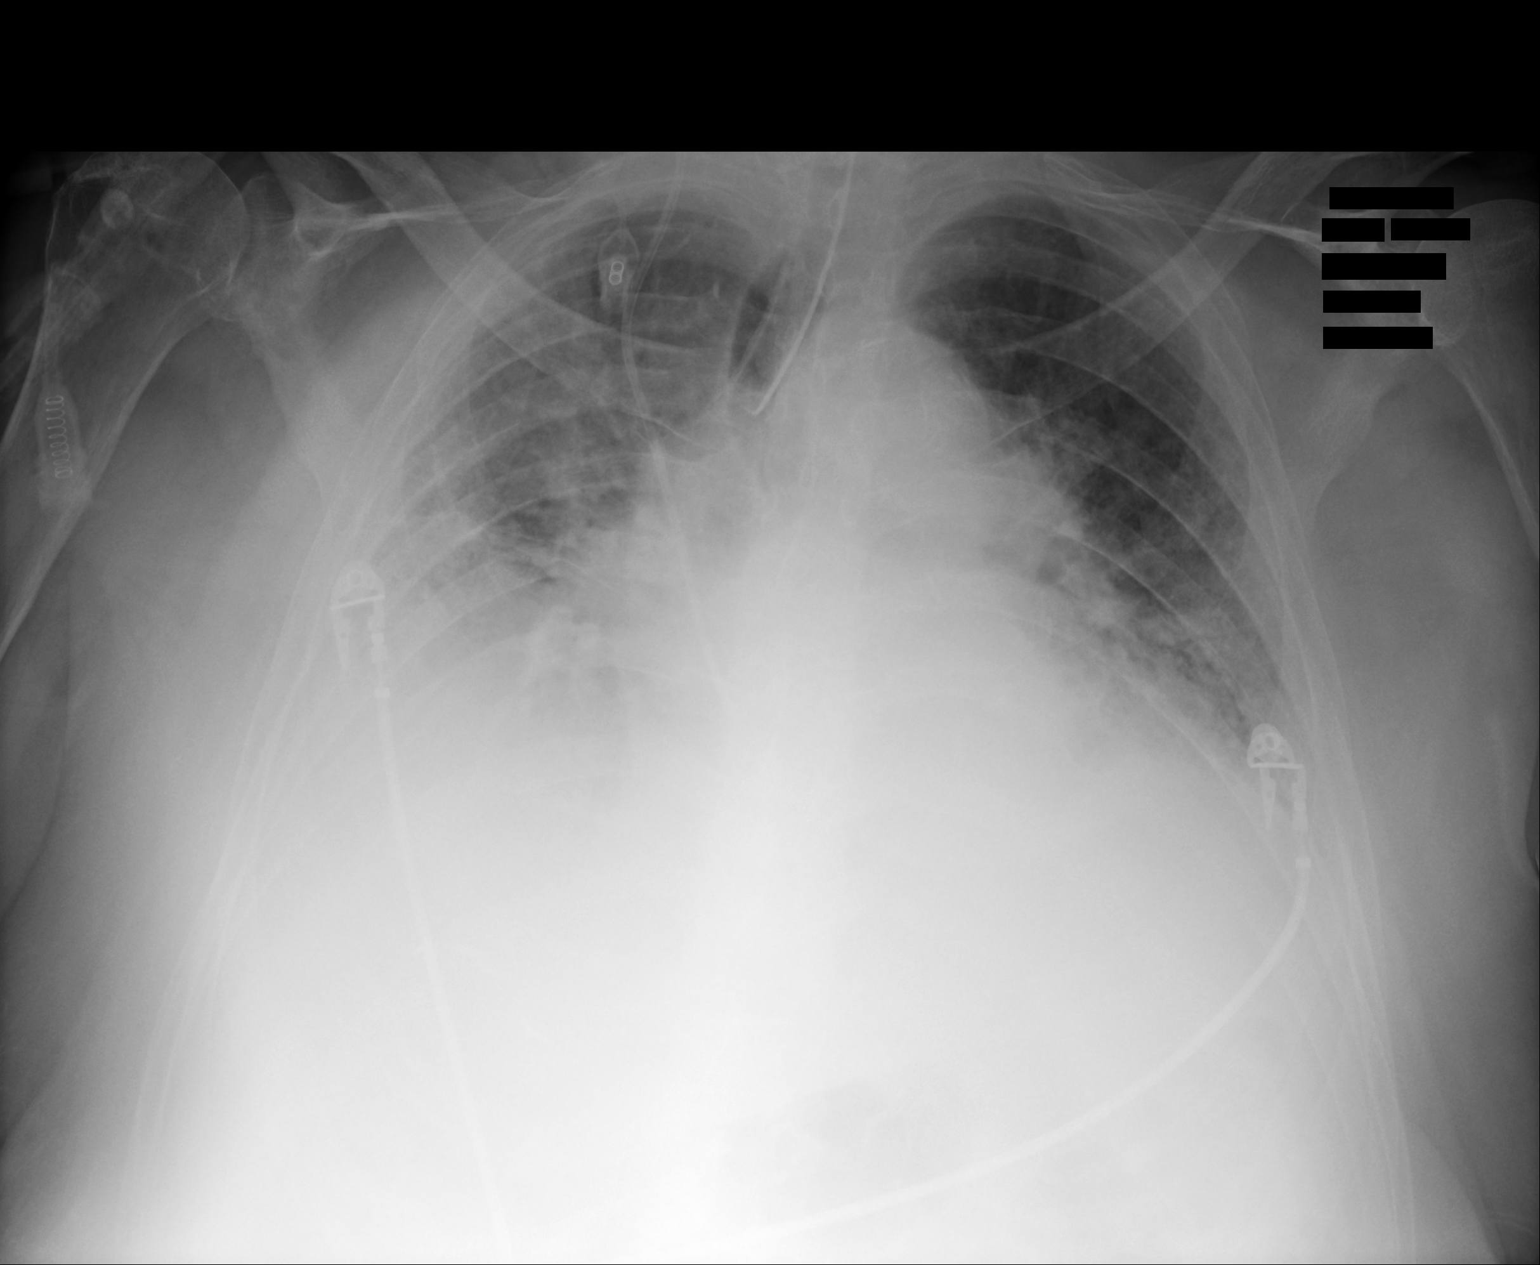

[1 of 1 positions shown; findings below may reference images not displayed]

PROCEDURE:     DXR - DXR PORTABLE CHEST SINGLE VIEW  - August 19, 2012  [DATE]

RESULT:     Comparison is made to the study 18 August, 2012 at [DATE] p.m.
Endotracheal tube is present at the level of the sternoclavicular joints. A
right internal jugular central venous catheter passes into the area the
right atrium. The cardiac silhouette is enlarged. There is very shallow
inspiration with basilar infiltrate or atelectasis and small effusions along
with diffuse interstitial edema. Monitoring electrodes are present.
IMPRESSION: Worsening basilar infiltrate or atelectasis. Continued
cardiomegaly. Increasing interstitial edema.

[REDACTED]

## 2013-07-25 IMAGING — CR DG CHEST 1V PORT
1 series · 1 of 1 positions shown · non-contrast
Comparison: none

REASON FOR EXAM: follow up pneumonia
COMMENTS:

PROCEDURE:     DXR - DXR PORTABLE CHEST SINGLE VIEW  - August 27, 2012  [DATE]
RESULT:     Comparison made to prior study of 08/25/2012. Endotracheal tube
in stable position. Central line in stable position. Cardiomegaly.
Atelectasis versus pneumonia lung bases. Chest is unchanged from 08/25/2012.

[ap]
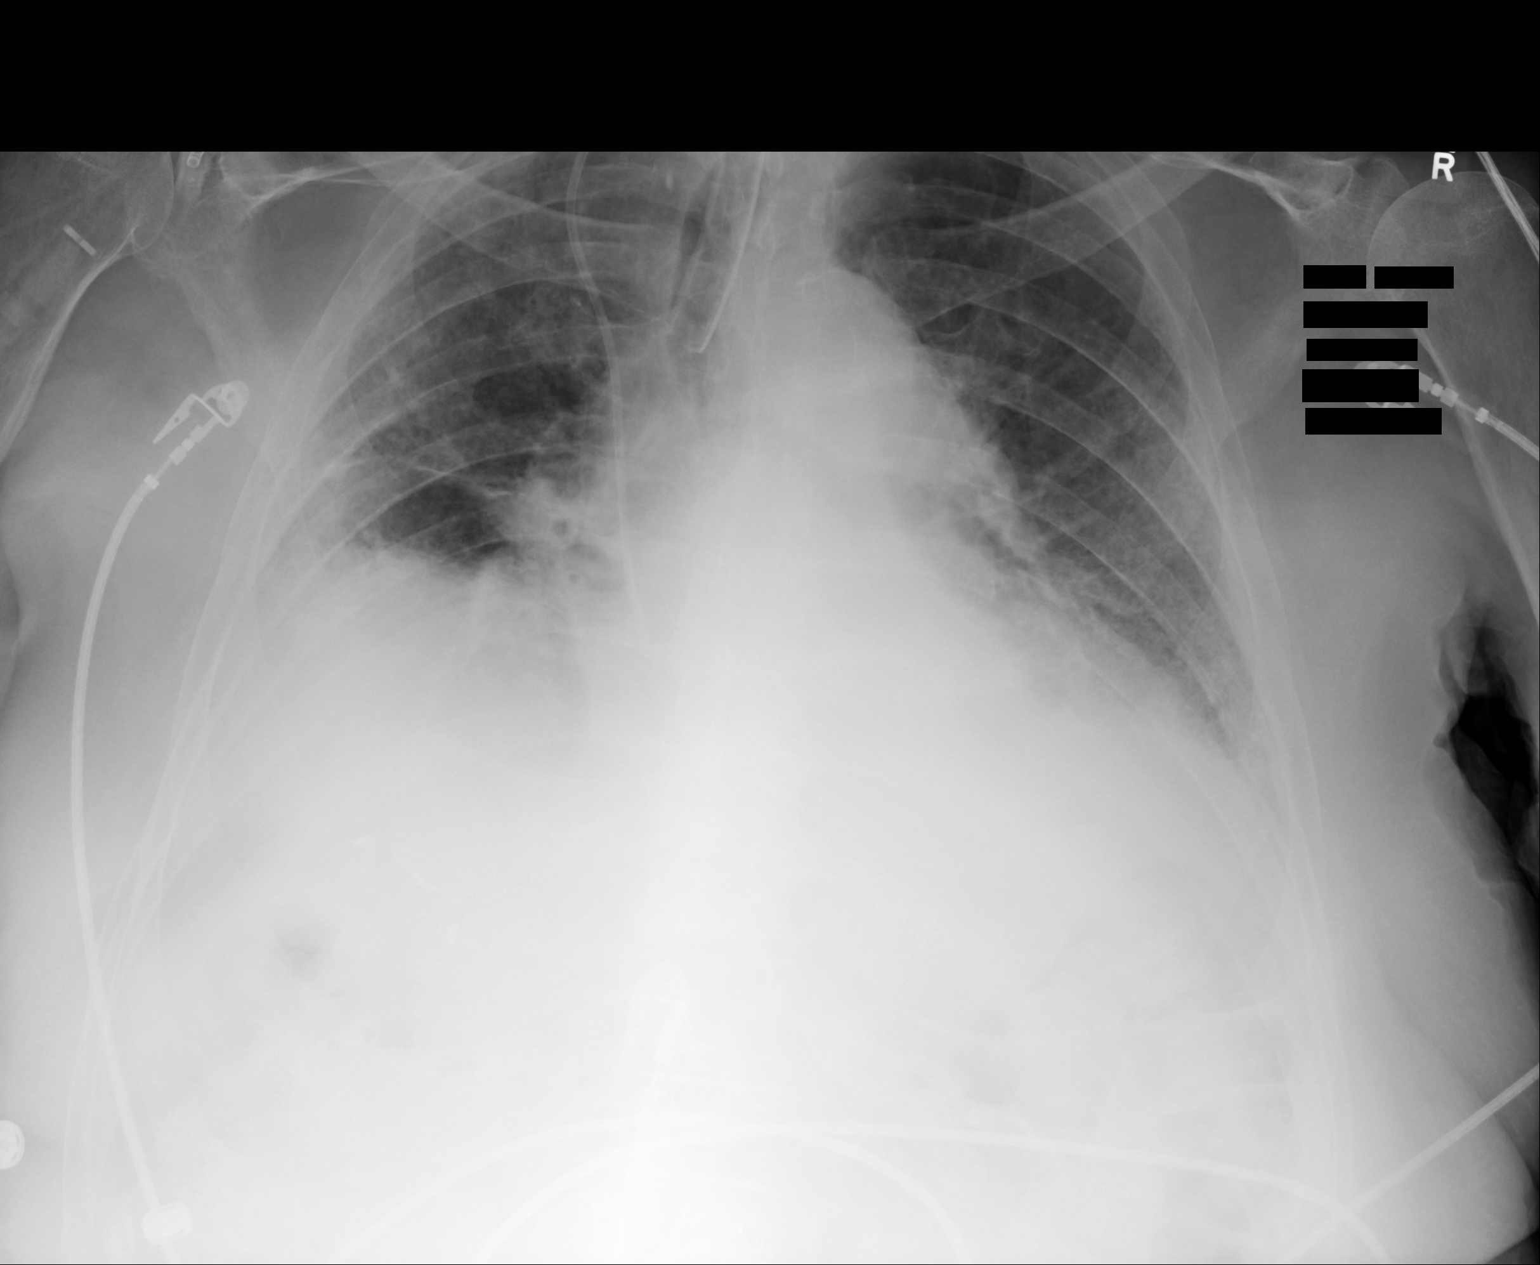

[1 of 1 positions shown; findings below may reference images not displayed]

IMPRESSION: Stable chest from 08/25/2012.

## 2015-01-29 NOTE — Consult Note (Signed)
Chief Complaint:   Subjective/Chief Complaint Patient's Dobhoff which was endoscpically placed yesterday is no longer functioning. Multiple attempts to declogg it failed. An NG tube was attempted but failed as it has in the past on multiple occassions. Will proced with EGD assisted naso/oro gastric tube placement.   Electronic Signatures: Jill Side (MD)  (Signed [phone] 15:22)  Authored: Chief Complaint   Last Updated: [id number] 15:22 by Jill Side (MD)

## 2015-01-29 NOTE — Op Note (Signed)
PATIENT NAME:  Franklin Edwards, Franklin Edwards MR#:  [id number] DATE OF BIRTH:  1932-07-07  DATE OF PROCEDURE:  08/16/2012  PREOPERATIVE DIAGNOSES:  1. Cardiac arrest.  2. Pulmonary failure. 3. Pulmonary fibrosis. 4. Pneumonia. 5. Complication of vascular device.   POSTOPERATIVE DIAGNOSES:  1. Cardiac arrest.  2. Pulmonary failure. 3. Pulmonary fibrosis. 4. Pneumonia. 5. Complication of vascular device.   PROCEDURE PERFORMED: Insertion of right IJ triple lumen catheter with ultrasound guidance.   SURGEON: Katha Cabal, M.D.   INDICATIONS: Mr. Kempfer is in the Intensive Care Unit status post arrest with pneumonia and multisystem organ dysfunction. He is now intubated. Left IJ central line has been placed, however, chest x-ray demonstrates the tip is in the subclavian/axillary region. There is also deviation of the trachea to the right, history of pulmonary fibrosis, and radiation therapy suggesting there is innominant stenosis stricture or external compression. Therefore, a line will be placed on the right-hand side. The risks and benefits were reviewed. The patient requires emergent IV access. Permission for central line placement has already been given by the family.   DESCRIPTION OF PROCEDURE: The patient is in the Intensive Care Unit positioned supine. The right neck is prepped and draped in sterile fashion. 1% lidocaine is infiltrated into the soft tissues. Jugular vein is imaged. It is noted to be quite a bit smaller, in fact is smaller than the carotid artery next to it. Under direct visualization, after the vein has been imaged, it is compressible and indicating patency. Image is recorded. Micropuncture kit is then opened onto the field given this finding. The microneedle is inserted without difficulty on a single pass and microwire is then advanced. There is significant difficulty with advancing the wire and once again it appears to be going down the subclavian. With simultaneous manipulation of  wire and supraclavicular pressure, the wire passes up to its end and ventricular arrhythmia is noted. The wire is repositioned and the arrhythmia halts. Microsheath is then passed over the wire, a counterincision made with an 11 blade scalpel, J-wire advanced, dilator is passed over the wire and then the triple-lumen catheter is advanced. The catheter aspirates and flushes easily, all three lumens. It is then secured to the skin of the neck with 2-0 silk and a sterile dressing is applied. The patient tolerated the procedure well and a chest x-ray is pending.  ____________________________ Katha Cabal, MD ggs:slb D: 08/16/2012 21:59:03 ET T: 08/17/2012 09:45:28 ET JOB#: [id number]  cc: Katha Cabal, MD, <Dictator> Katha Cabal MD ELECTRONICALLY SIGNED 08/23/2012 12:14

## 2015-01-29 NOTE — Consult Note (Signed)
    Comments   Pt's son and daughter verbalized they are ready for terminal extubation. Pt noted to have spontaneous respirations. Orders entered. Pt extubated and family at bedside. Emotional support provided. Chaplain also present.  20 minutes  Electronic Signatures: Kerilyn Cortner, Kirt Boys (NP)  (Signed [phone] 13:59)  Authored: Palliative Care Phifer, Izora Gala (MD)  (Signed [phone] 16:55)  Authored: Palliative Care   Last Updated: 18-Nov-13 16:55 by Phifer, Izora Gala (MD)

## 2015-01-29 NOTE — Consult Note (Signed)
Chief Complaint:   Subjective/Chief Complaint EGD performed and a Dobhoff tube placed through the oral cavity with distal tip in antrum. Patient tolerated the procedure well. May start using the tube immediately. Will sign off. Plesae let us know if any questions or concerns.   Electronic Signatures: Jill Side (MD)  (Signed [phone] 10:33)  Authored: Chief Complaint   Last Updated: 09-Nov-13 10:33 by Jill Side (MD)

## 2015-01-29 NOTE — Consult Note (Signed)
Comments   I met with pt's son and daughter. They understand that pt is at risk for re-intubation. Family is very conflicted about how aggressive to be with pt's care. Last night, they were prepared to withdraw care but then pt started to respond to them. They say that pt "wanted to live" and enjoyed his quality of life. They feel that if pt could get to a SNF, even if wheel chair bound, he would accept that quality of life. After much discussion, they have decided to continue aggressive care and re-intubate if needed. discussed with Dr Mortimer Fries.   Electronic Signatures: Ritesh Opara, Izora Gala (MD)  (Signed [phone] 17:09)  Authored: Palliative Care   Last Updated: 13-Nov-13 17:09 by Lanika Colgate, Izora Gala (MD)

## 2015-01-29 NOTE — Discharge Summary (Signed)
PATIENT NAME:  Franklin Edwards, Franklin Edwards MR#:  [id number] DATE OF BIRTH:  January 23, 1932  DATE OF ADMISSION:  08/12/2012 DATE OF DISCHARGE:  08/30/2012  Interim discharge summary was dictated by Dr. Tressia Miners on 11/15.   FINAL DIAGNOSES:  1. Acute hypoxic respiratory failure.  2. Bilateral pneumonia.  3. Chronic pulmonary fibrosis secondary to exposure to bleomycin.  4. History of testicular cancer status post surgery and chemotherapy.  5. Metabolic encephalopathy secondary to carbon dioxide retention.  6. Acute renal failure.  7. Septic shock versus cardiogenic shock.  8. T11 compression fracture.  9. Cardiomyopathy, ejection fraction of 40%.  10. Anemia of chronic disease.   BRIEF SUMMARY OF HOSPITAL COURSE: Covering from 11/16 to 11/19 Mr. Frieson is a 81 year old Caucasian gentleman who was admitted on 11/01 with:  1. Acute hypoxic respiratory failure and had to be intubated secondary to pneumonia along with chronic pulmonary fibrosis. Patient was on broad-spectrum antibiotics, continued high dose of IV steroids. He self extubated himself, however, patient needed to be reintubated and was not able to be weaned off the ventilator. After family discussion was made by palliative care, Dr. Ermalinda Memos, family decided on terminal extubation and thereby patient was made comfort measures and was discharged to hospice home on 11/19.  2. Encephalopathy metabolic secondary to CO2 retention in the setting of chronic respiratory failure. No CVA noted on MRI.  3. Acute on chronic renal failure. Creatinine improved with IV hydration.  4. T11 compression fracture.  5. Demand ischemia with history of cardiomyopathy with mild elevated troponin in the setting of respiratory failure secondary to demand ischemia. Patient was medically treated.  6. Patient initially was a LIMITED CODE then family agreed on NO CODE, DO NOT RESUSCITATE. He was discharged to hospice home on 08/30/2012.    TIME SPENT: 40 minutes.    ____________________________ Hart Rochester Posey Pronto, MD sap:cms D: 09/01/2012 17:22:14 ET T: 09/02/2012 12:14:30 ET JOB#: [id number]  cc: Farren Nelles A. Posey Pronto, MD, <Dictator>  Ilda Basset MD ELECTRONICALLY SIGNED 09/04/2012 13:39

## 2015-01-29 NOTE — Consult Note (Signed)
    Comments   Followup visit made. Pt extubated this am and did poorly afterwards. Now on BIPAP (10 PS, 5 peep, 35% fio2) and precedex. Poorly responsive. Declining with high risk of needing reintubation. Will try to speak with family to clarify goals.     Electronic Signatures: Tiffany Talarico, Kirt Boys (NP)  (Signed [phone] 16:26)  Authored: Palliative Care   Last Updated: 13-Nov-13 16:26 by Irean Hong (NP)

## 2015-01-29 NOTE — Consult Note (Signed)
Brief Consult Note: Diagnosis: Need for gastric access for meds and nutrition.   Patient was seen by consultant.   Consult note dictated.   Comments: Patient needs an NG or OG access for medicines and nutrition. Attempts to pass tubes through nose and mouth faiiled probably secondary to an esophageal stricture.  Recommendations: Hold ASA tody. Hold Heparin after mid night. Will plan on performing an EGD tomorrow with possible dilation and passage of NG tube.  Electronic Signatures: Jill Side (MD)  (Signed [phone] 12:49)  Authored: Brief Consult Note   Last Updated: 08-Nov-13 12:49 by Jill Side (MD)

## 2015-01-29 NOTE — Consult Note (Signed)
PATIENT NAME:  Franklin Edwards, Franklin Edwards MR#:  [id number] DATE OF BIRTH:  28-Apr-1932  DATE OF CONSULTATION:  08/19/2012  REFERRING PHYSICIAN:   CONSULTING PHYSICIAN:  Jill Side, MD  REASON FOR CONSULTATION: Inability to pass A NG tube.   HISTORY OF PRESENT ILLNESS: This is a 81 year old male with history of orthostatic hypotension, chronic kidney disease, testicular cancer, and abdominal aortic aneurysm. The patient was admitted with respiratory failure thought to be secondary to pneumonia. The patient currently is on a ventilator. Multiple attempts to pass a NG or Dobbhoff tube for feeding as well as for administration of medicines have failed. The patient has history of esophageal stricture that was dilated I believe last year by Dr. Candace Edwards. Gastroenterology was consulted for assistance in placing a NG or OG tube for medicines and nutrition. The patient was evaluated. He is deeply sedated and intubated. No family members are available. The patient overall appears comfortable.   PAST MEDICAL HISTORY:  1. History of orthostatic hypotension. 2. History of testicular cancer. 3. Chronic kidney disease. 4. Abdominal aortic aneurysm. 5. History of esophageal stricture. 6. Diverticulosis. 7. Chronic anemia.  8. Pulmonary fibrosis.   PAST SURGICAL HISTORY:  1. Hernia repair. 2. Cholecystectomy.   ALLERGIES: Penicillin.   CURRENT MEDICATIONS: Fentanyl drip, Versed drip, Levophed, esmolol, Tylenol, aspirin suppositories, insulin sliding scale, albuterol, meropenem, inhalers, vancomycin IV, heparin subcutaneous, methylprednisone injection as well as Protonix injection.   REVIEW OF SYSTEMS: Not available.   PHYSICAL EXAMINATION:   VITAL SIGNS: Temperature 97.4, pulse 78, respirations 12, and blood pressure 111/54.   NECK: Neck veins are flat.   PULMONARY: Lungs are grossly clear to auscultation bilaterally with fair air entry and no added sounds.   CARDIOVASCULAR: Regular rate and rhythm. No  gallops.   ABDOMEN: Flat abdomen. Bowel sounds are positive. No hepatosplenomegaly was noted.   NEUROLOGIC: The patient is deeply sedated.   LABS/RADIOLOGIC STUDIES:  PTT is 87. White cell count is 70,000, hemoglobin is 10.2, hematocrit 30, and platelet count is 87. Electrolytes: Glucose is 171, BUN 36, creatinine 1.25, sodium 146, and potassium 4.2.   CT of head without contrast is unremarkable.   ASSESSMENT AND PLAN: The patient with a need for OG or NG access for feedings as well as for medications. Nursing staff has had significant difficulty in passing NG tube, OG tube or even a Dobbhoff tube. There is concern that this may be due to an esophageal stricture and therefore blind passage may not be safe. We will proceed with an upper GI endoscopy tomorrow for further evaluation and attempted placement of a NG tube during an endoscopy. The patient has been on aspirin, which will be held today. His subcutaneous heparin will be held after midnight. His platelet count is somewhat low, but acceptable for such a procedure, at 87,000.          There are somewhat increase risks if esophageal dilation is needed, although dilation may not be required and further recommendations and plans will be made depending on the findings during an upper GI endoscopy.   Thank you for involving [place] in the care of Mr. Franklin Edwards. We will follow.  ____________________________ Jill Side, MD si:slb D: 08/19/2012 12:56:35 ET T: 08/19/2012 13:37:43 ET JOB#: [id number]  cc: Jill Side, MD, <Dictator> Jill Side MD ELECTRONICALLY SIGNED 08/20/2012 10:05

## 2015-01-29 NOTE — Consult Note (Signed)
Brief Consult Note: Diagnosis: Inferior STEMI, in setting of pneumonia and respiratory failure.   Patient was seen by consultant.   Consult note dictated.   Comments: REC Agree with current therapy, defer immediate invasive eval and PCI due to comorbidities, patient's family understands and agrees.  Electronic Signatures: Isaias Cowman (MD)  (Signed [phone] 08:47)  Authored: Brief Consult Note   Last Updated: 06-Nov-13 08:47 by Isaias Cowman (MD)

## 2015-01-29 NOTE — Consult Note (Signed)
Comments   Billey Chang, NP, and I met with pt's son, daughter and other family members. They have decided that they do not want to pursue trach and that the best option for pt is to extubate to comfort care. Will proceed after family has had a chance to be with pt. Orders entered.   Electronic Signatures: Marcey Persad, Izora Gala (MD)  (Signed [phone] 12:19)  Authored: Palliative Care   Last Updated: 18-Nov-13 12:19 by Nicolas Banh, Izora Gala (MD)

## 2015-01-29 NOTE — Op Note (Signed)
PATIENT NAME:  Franklin Edwards, Franklin Edwards MR#:  [id number] DATE OF BIRTH:  1932-09-03  DATE OF PROCEDURE:  08/16/2012  PREOPERATIVE DIAGNOSES:  1. Cardiac arrest.  2. Pneumonia.  3. Multisystem organ failure.  4. Pulmonary fibrosis.   POSTOPERATIVE DIAGNOSES:  1. Cardiac arrest.  2. Pneumonia.  3. Multisystem organ failure.  4. Pulmonary fibrosis.   PROCEDURE PERFORMED: Insertion left IJ central line with ultrasound guidance.   SURGEON: Hortencia Pilar, M.D.   DESCRIPTION OF PROCEDURE: The patient is in the Intensive Care Unit, critically ill. He has just arrested and now is intubated and requires multiple medications for sustaining his life.   The patient is positioned supine. Left IJ is imaged with ultrasound. It is noted to be of normal caliber and easily compressible, which indicates patency. Image is recorded and under direct ultrasound visualization after prepping and draping the left neck and 1% lidocaine has been infiltrated into the skin, a Seldinger needle is inserted. The J-wire is advanced. There is significant difficulty in advancing the wire; however, with manipulations the wire does advance to the last 5 or 6 cm. However, ventricular arrhythmia is never observed on the monitor. Dilator is passed over the wire, catheter is passed over the wire, and all three lumens aspirate easily and flush well. The catheter is secured to the skin of the neck with 2-0 silk and a sterile dressing is applied. The patient tolerated the procedure well and there were no immediate complications. Chest x-ray is pending.   ____________________________ Katha Cabal, MD ggs:bjt D:  08/16/2012 21:55:42 ET           T: 08/17/2012 10:14:40 ET          JOB#: [id number] Dolores Lory Mckennon Zwart MD ELECTRONICALLY SIGNED 08/23/2012 12:14

## 2015-01-29 NOTE — Consult Note (Signed)
PATIENT NAME:  Franklin Edwards, HELLSTROM MR#:  [id number] DATE OF BIRTH:  [birth year]  DATE OF CONSULTATION:  08/17/2012  REFERRING PHYSICIAN:  Theodoro Grist, MD CONSULTING PHYSICIAN:  Isaias Cowman, MD  PRIMARY CARE PHYSICIAN: Domenick Gong, MD  CHIEF COMPLAINT: He fell.  REASON FOR CONSULTATION: For evaluation of abnormal EKG worrisome for myocardial infarction.  HISTORY OF PRESENT ILLNESS: The patient is a 81 year old gentleman who was admitted on 08/12/2012 after falling. The patient had recent shortness of breath and productive cough with chest x-ray showing possible pneumonia. The patient developed respiratory failure and was admitted to the Critical Care Unit requiring mechanical ventilation. The patient was noted to have an abnormal EKG which revealed ST elevations in leads III and aVF with elevated troponin of 0.33.   PAST MEDICAL HISTORY:  1. 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 2. History of recurrent falling due to orthostatic hypotension.  3. Abdominal aortic aneurysm.  4. Anemia of chronic disease.  5. Dysphagia with esophageal stretching.   ADMISSION MEDICATIONS:  1. Levofloxacin 750 mg every 24 hours. 2. Potassium 20 mEq daily.  3. Fludrocortisone 0.1 mg daily.  4. Ferrous sulfate 325 mg twice a day. 5. Omeprazole 20 mg twice a day. 6. Vitamin D2 50,000 international units daily. 7. Finasteride 5 mg daily.  8. Rivastigmine 3 mg twice a day. 9. Vicodin 1 tablet every 4 hours. 10. Pro Air 2 puffs every four hours   SOCIAL HISTORY: The patient currently resides in assisted living at [facility]. There is no prior history of tobacco or EtOH abuse.   FAMILY HISTORY: Mother died from CVA and history of cerebral aneurysm, father with history of coronary artery disease.   REVIEW OF SYSTEMS: Not obtainable. The patient is currently on mechanical ventilator.   PHYSICAL EXAMINATION:   VITAL SIGNS: Blood  pressure 124/87 and pulse 110 and regular.  GENERAL: The patient currently is heavily sedated on mechanical ventilator.   HEENT: Pupils equal and reactive to light and accommodation.   NECK: Supple without thyromegaly.   LUNGS: Clear.   HEART: Normal jugular venous pressure. Normal point of maximal impulse. Regular rate and rhythm. Normal S1 and S2. No appreciable gallop, murmur, or rub.   ABDOMEN: Soft and nontender. Pulses were intact bilaterally.   MUSCULOSKELETAL: Normal muscle tone.   NEUROLOGIC: The patient is heavily sedated, currently on mechanical ventilator.   IMPRESSION: This is a 81 year old gentleman currently a resident of an assisted living facility with recent history of recurrent falls of unknown etiology who presents after a fall and pneumonia requiring mechanical ventilation and respiratory support who developed an acute inferior myocardial infarction of unknown duration. The patient has minimal elevation in troponin.     RECOMMENDATIONS:  1. Agree with overall current therapy.  2. Heparin bolus and drip.  3. Add low dose beta blocker. 4. 2-D echocardiogram to evaluate left ventricular function.  5. Discussed with family members and will defer invasive cardiac evaluation and potential percutaneous coronary intervention at this time since the patient has serious comorbidities including respiratory failure due to pneumonia. At this point, the benefits of primary PCI do not appear to outweigh the risks.  ____________________________ Isaias Cowman, MD ap:slb D: 08/17/2012 08:45:08 ET T: 08/17/2012 10:30:09 ET JOB#: [id number]  cc: Isaias Cowman, MD, <Dictator> Isaias Cowman MD ELECTRONICALLY SIGNED 08/27/2012 10:04

## 2015-01-29 NOTE — Consult Note (Signed)
Chief Complaint:   Subjective/Chief Complaint OG placed with distal tip in antrum. May use tube as needed.   Electronic Signatures: Jill Side (MD)  (Signed [phone] 16:03)  Authored: Chief Complaint   Last Updated: 10-Nov-13 16:03 by Jill Side (MD)

## 2015-01-29 NOTE — H&P (Signed)
PATIENT NAME:  Franklin Edwards, Franklin Edwards MR#:  [id number] DATE OF BIRTH:  12-Oct-1932  DATE OF ADMISSION:  08/12/2012  PRIMARY CARE PHYSICIAN: Dr. Ilene Qua  REFERRING PHYSICIAN: Dr. Owens Shark  CHIEF COMPLAINT: Recurrent fall.   HISTORY OF PRESENT ILLNESS: 81 year old Caucasian male with history of orthostatic hypotension, chronic kidney disease, testicular cancer, abdominal aortic aneurysm presented to ED with above chief complaint. Patient was sent from nursing home due to recurrent fall recently. Patient fell yesterday in nursing home and then was sent to ED for further evaluation. Patient denies any loss of consciousness but complains of dizziness and generalized weakness. Also he has a cough with sputum and mild shortness of breath but denies any wheezing or hemoptysis. He denies any fever, chills. No orthopnea, nocturnal dyspnea or leg edema. Patient has good appetite. He uses walker in nursing home. Denies any dysuria, hematuria, or diarrhea. Patient's chest x-ray showed possible pneumonia and was treated with antibiotics in ED. In addition patient's renal function is worse than before.   PAST MEDICAL HISTORY:  1. Orthostatic hypotension. 2. Testicular cancer status post chemotherapy.  3. Chronic kidney disease with base creatinine 1.7 to 2.  4. Abdominal aortic aneurysm. 5. Dysphagia with esophageal stretching.  6. Diverticulosis.  7. Anemia of chronic disease. 8. Pulmonary fibrosis secondary to metastasis from testicular cancer.  9. First-degree AV block.   PAST SURGICAL HISTORY:  1. Abdominal aortic aneurysm repair.  2. Surgery for testicular cancer.  3. Hernia repair. 4. Cholecystectomy. 5. Left hand fingers amputated from trauma.   SOCIAL HISTORY: Living in nursing home. Denies any smoking, alcohol drinking or illicit drugs.   FAMILY HISTORY: Mother had brain aneurysm and died of stroke. Father had atherosclerotic disease and heart disease.   REVIEW OF SYSTEMS: CONSTITUTIONAL: Patient denies  any fever, chills. No headache but has dizziness, weakness. EYES: No double vision, blurred vision. ENT: No epistaxis, postnasal drip, slurred speech, or dysphagia. CARDIOVASCULAR: No chest pain, palpitation, orthopnea, or nocturnal dyspnea. No leg edema. PULMONARY: Positive for cough, sputum, and shortness of breath. No hemoptysis. GASTROINTESTINAL: No abdominal pain, nausea, vomiting, or diarrhea. No melena or bloody stool. GENITOURINARY: No dysuria, hematuria. ENDOCRINE: No polyuria, polydipsia. HEMATOLOGY: No easy bruising, bleeding. NEUROLOGY: No syncope, loss of consciousness or seizure. SKIN: No rash or jaundice but has some bruises due to fall.   PHYSICAL EXAMINATION:  VITAL SIGNS: Temperature 98.6, blood pressure 99/64, pulse 79, respirations 20, oxygen saturation 95% on room air.   GENERAL: Patient is alert, awake, oriented, in no acute distress.   HEENT: Pupils round, equal, reactive to light, accommodation. Moist oral mucosa. Clear oropharynx.   NECK: Supple. No JVD or carotid bruits. No lymphadenopathy. No thyromegaly.   CARDIOVASCULAR: S1, S2 regular rate, rhythm. No murmurs, gallops.   PULMONARY: Bilateral air entry. No wheezing but has mild crackles bilateral base. No use of accessory muscles to breathe.   ABDOMEN: Soft. No distention or tenderness. No organomegaly. Bowel sounds present.   EXTREMITIES: No edema, clubbing, or cyanosis. No calf tenderness. Strong bilateral pedal pulses but it is warmer on the left side than on the right side with mild ankle swelling but no erythema, no tenderness.   SKIN: No rash or jaundice.   NEUROLOGIC: Alert and oriented x3. Power about 2/5 bilateral legs 5/5 bilateral arms. Sensation intact. Deep tendon reflexes mute.   LABORATORY, DIAGNOSTIC, AND RADIOLOGICAL DATA: Lumbar spine x-ray showed mild anterior wedge compression deformity of T1 vertebral body, age indeterminate but new from prior.    Thoracic  spine x-ray showed mild age  indeterminate anterior wedge compression deformity in the cervicolumbar junction.    Duplex showed no deep vein thrombosis.   WBC 11, hemoglobin 11.4, platelets 145, glucose 139, BUN 49, creatinine 2.26, sodium 143, potassium 3.8, chloride 110, bicarbonate 24.   ALLERGIES: Penicillin.   HOME MEDICATIONS:  1. Vitamin D2 50,000 international units 1 cap once a week.  2. Potassium 20 mEq once a day.  3. Omeprazole 20 mg p.o. b.i.d.  4. Levaquin 750 mg p.o. q.24 hours.  5. Fludrocortisone 0.1 mg p.o. daily.  6. Ferrous sulfate 1 tablet p.o. b.i.d.   IMPRESSION:  1. Possible community-acquired pneumonia.  2. Acute renal failure with chronic kidney disease.  3. History of orthostatic hypotension, blood pressure is on lower side.   4. Questionable T1 compression fracture.  5. Frequent fall.  6. Anemia.   PLAN OF TREATMENT:  1. Patient will be admitted to medical floor. Will continue Levaquin, add meropenem to cover Pseudomonas pneumonia.  2. Will follow up blood culture, sputum culture and CBC.  3. For acute renal failure we will start normal saline IV and follow up BMP.  4. Aspiration, fall precautions and will get physical therapy evaluation.  5. GI and deep vein thrombosis prophylaxis.   Discussed the patient's situation and plan of treatment with patient.   TIME SPENT: About 65 minutes.  ____________________________ Demetrios Loll, MD qc:cms D: 08/12/2012 08:42:51 ET T: 08/12/2012 09:04:05 ET JOB#: [id number]  cc: Demetrios Loll, MD, <Dictator> Fonnie Jarvis. Ilene Qua, MD Demetrios Loll MD ELECTRONICALLY SIGNED 08/17/2012 16:58
